# Patient Record
Sex: Male | Born: 1965 | Race: White | Hispanic: No | Marital: Married | Smoking: Never smoker
Health system: Southern US, Community
[De-identification: ages and names within clinical notes are randomized; demographics above are authoritative.]

## PROBLEM LIST (undated history)

## (undated) DIAGNOSIS — I10 Essential (primary) hypertension: Secondary | ICD-10-CM

## (undated) HISTORY — DX: Essential (primary) hypertension: I10

## (undated) HISTORY — PX: TONSILLECTOMY: SUR1361

## (undated) HISTORY — PX: OTHER SURGICAL HISTORY: SHX169

---

## 2007-09-15 ENCOUNTER — Emergency Department (HOSPITAL_COMMUNITY): Admission: EM | Admit: 2007-09-15 | Discharge: 2007-09-15 | Payer: Self-pay | Admitting: Emergency Medicine

## 2009-04-23 IMAGING — CR DG CHEST 2V
2 series · 2 of 2 positions shown · non-contrast
Comparison: No prior studies are available for comparison.

CLINICAL DATA: Chest pain. Shortness of breath. 
 CHEST - 2 VIEW:

[view not recorded (1 of 2)]
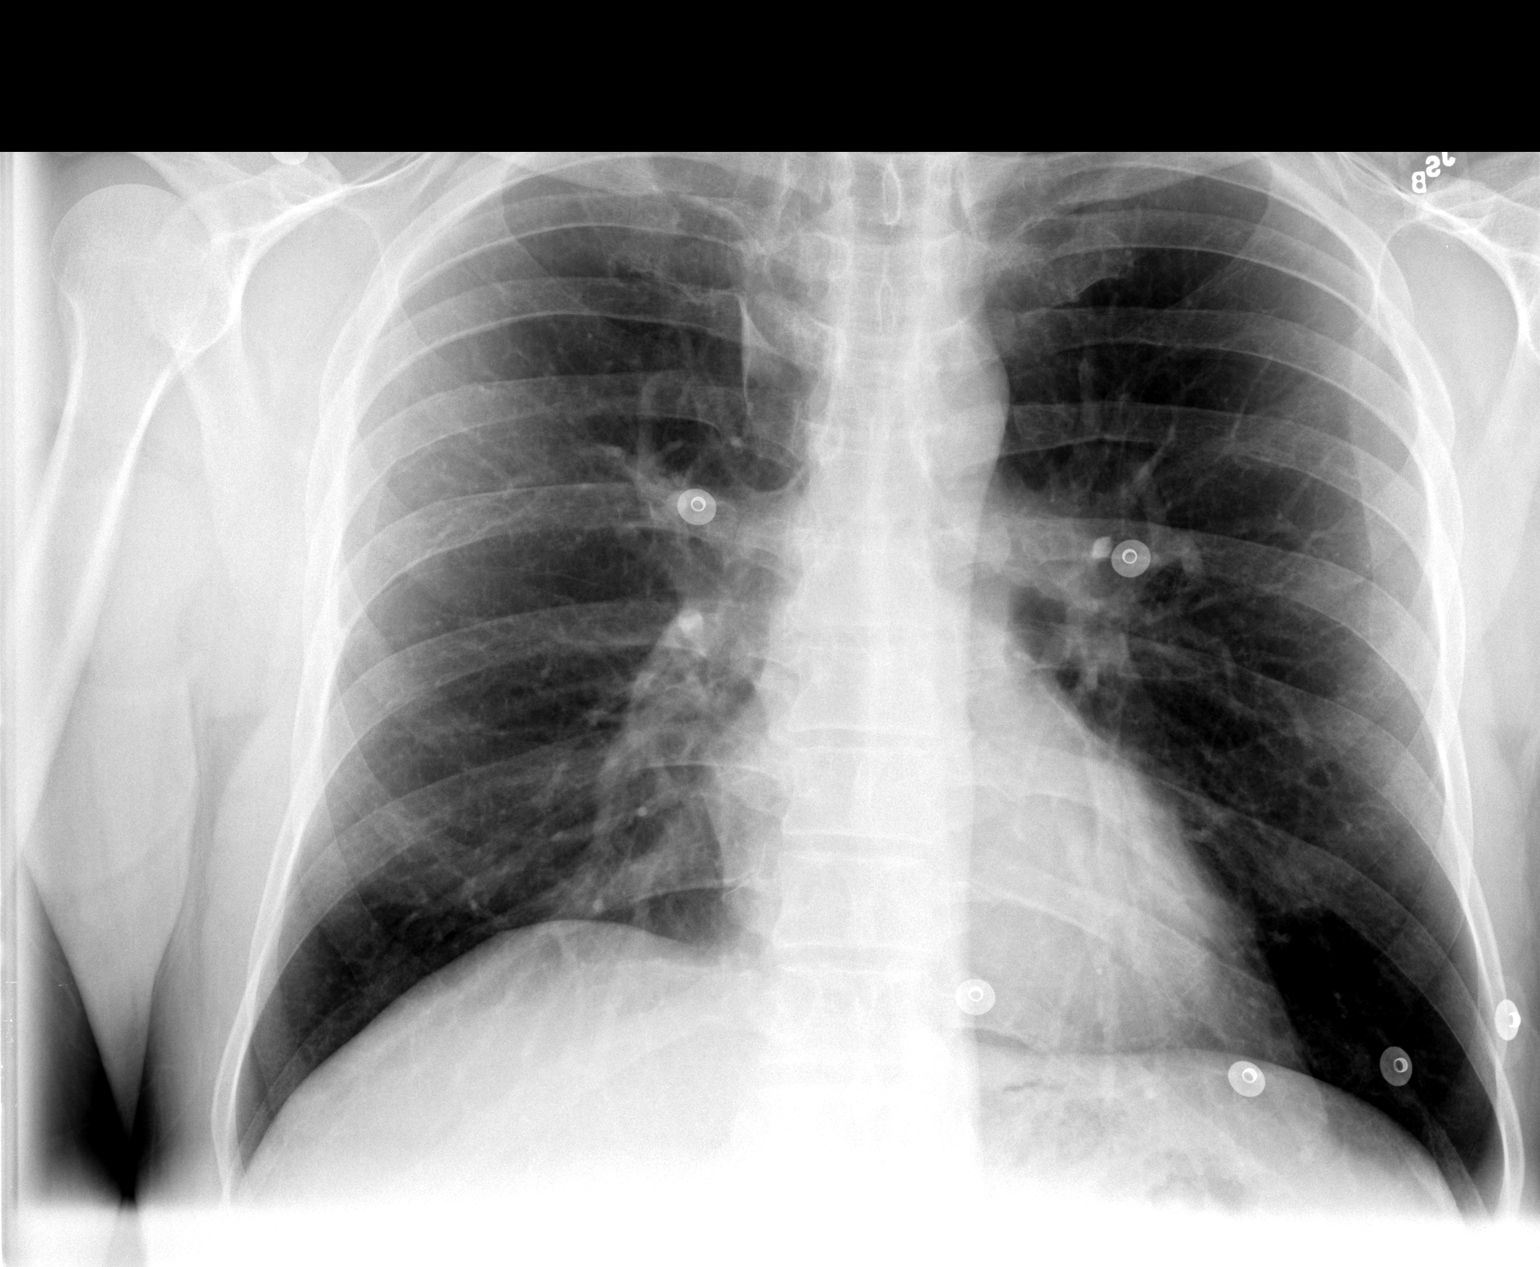

[view not recorded (2 of 2)]
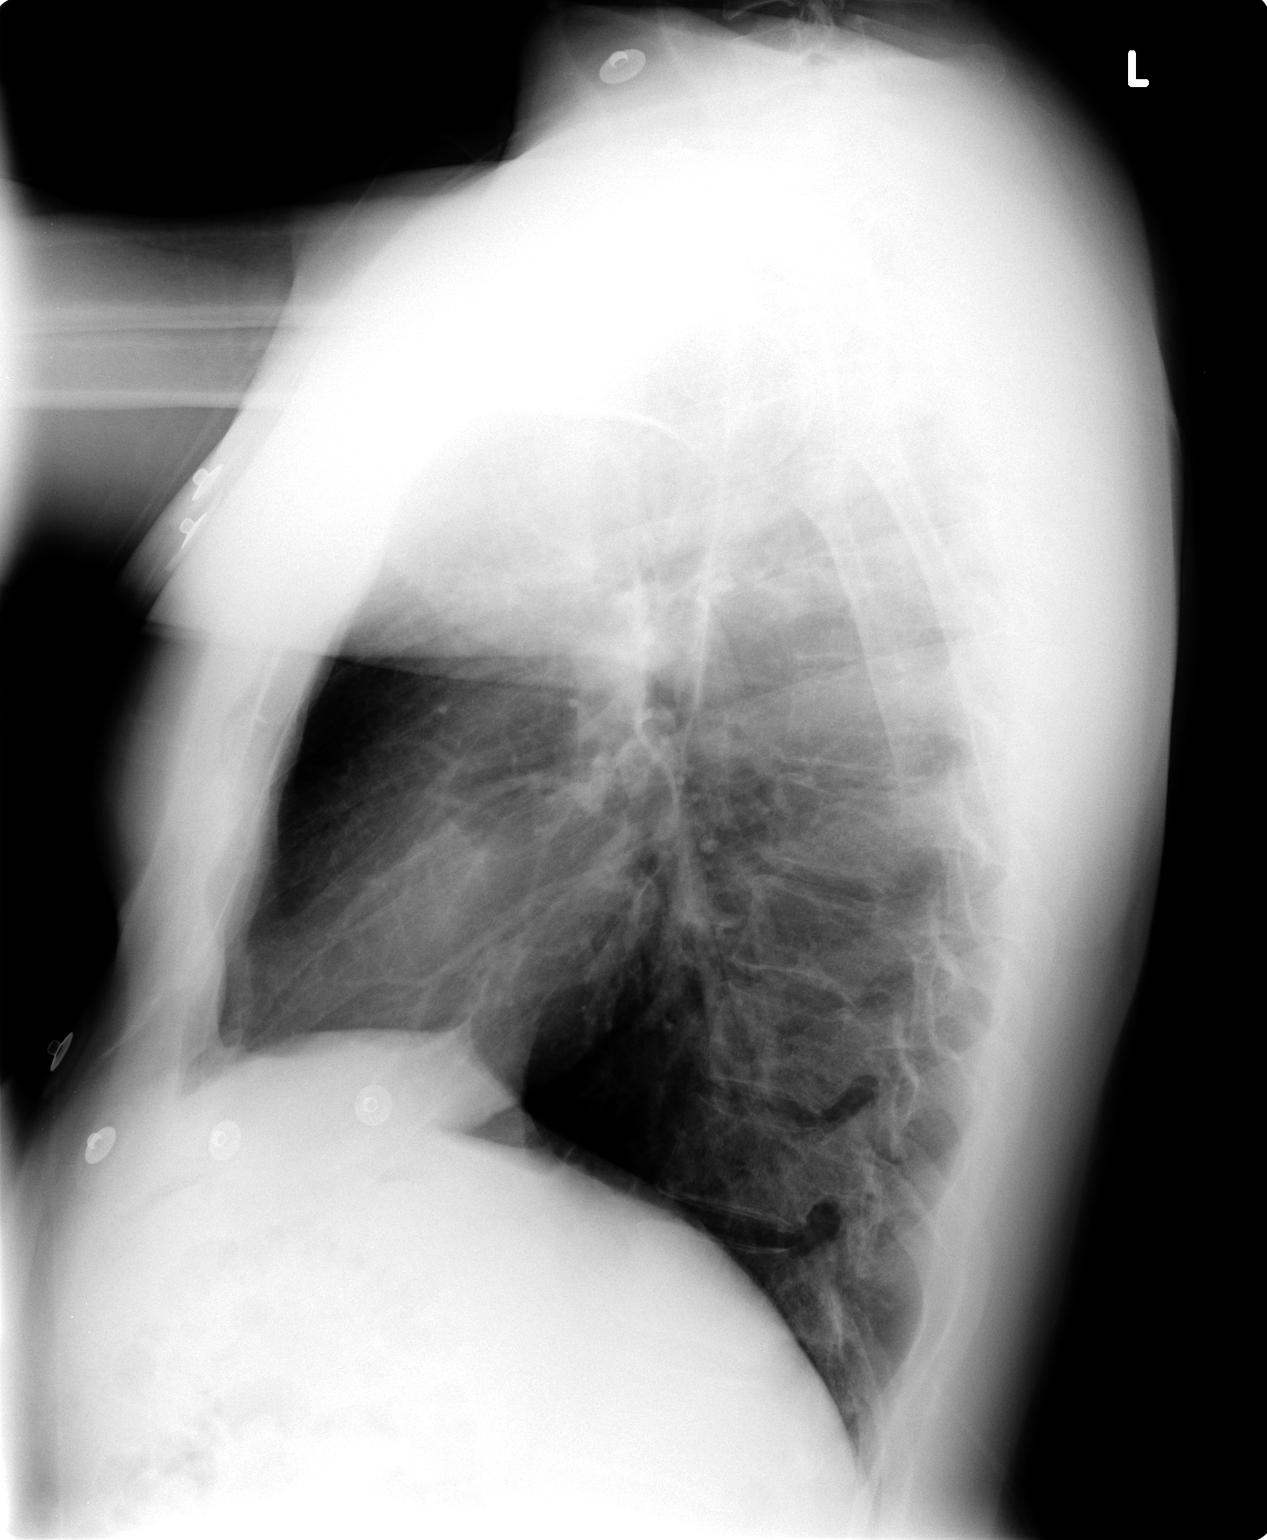

[2 of 2 positions shown; findings below may reference images not displayed]

FINDINGS: The heart and mediastinal contours are within normal limits. Suggestion of an azygos lobe, a normal variant. There is no pneumothorax, effusion, focal air space opacity, or edema.  No acute bony abnormality is identified.
IMPRESSION: No evidence of acute cardiopulmonary disease.

## 2010-06-14 ENCOUNTER — Ambulatory Visit: Payer: Self-pay | Admitting: Family Medicine

## 2010-06-14 ENCOUNTER — Encounter (INDEPENDENT_AMBULATORY_CARE_PROVIDER_SITE_OTHER): Payer: Self-pay | Admitting: *Deleted

## 2010-06-14 ENCOUNTER — Encounter: Payer: Self-pay | Admitting: Gastroenterology

## 2010-06-14 DIAGNOSIS — I10 Essential (primary) hypertension: Secondary | ICD-10-CM | POA: Insufficient documentation

## 2010-06-14 LAB — CONVERTED CEMR LAB
BUN: 15 mg/dL (ref 6–23)
Basophils Absolute: 0 10*3/uL (ref 0.0–0.1)
Basophils Relative: 0.3 % (ref 0.0–3.0)
Bilirubin, Direct: 0.1 mg/dL (ref 0.0–0.3)
CO2: 28 meq/L (ref 19–32)
Calcium: 9.5 mg/dL (ref 8.4–10.5)
Chloride: 104 meq/L (ref 96–112)
Cholesterol: 177 mg/dL (ref 0–200)
Creatinine, Ser: 0.9 mg/dL (ref 0.4–1.5)
Eosinophils Absolute: 0.1 10*3/uL (ref 0.0–0.7)
Glucose, Bld: 79 mg/dL (ref 70–99)
HDL: 37.6 mg/dL — ABNORMAL LOW (ref >39.00)
Lymphocytes Relative: 28.2 % (ref 12.0–46.0)
MCHC: 34.8 g/dL (ref 30.0–36.0)
MCV: 91.9 fL (ref 78.0–100.0)
Monocytes Absolute: 0.3 10*3/uL (ref 0.1–1.0)
Neutrophils Relative %: 62.4 % (ref 43.0–77.0)
PSA: 0.44 ng/mL (ref 0.10–4.00)
Platelets: 159 10*3/uL (ref 150.0–400.0)
RDW: 12.9 % (ref 11.5–14.6)
Total Bilirubin: 0.8 mg/dL (ref 0.3–1.2)
Total Protein: 7 g/dL (ref 6.0–8.3)
Triglycerides: 80 mg/dL (ref 0.0–149.0)

## 2010-07-14 ENCOUNTER — Ambulatory Visit: Payer: Self-pay | Admitting: Gastroenterology

## 2010-07-14 ENCOUNTER — Encounter (INDEPENDENT_AMBULATORY_CARE_PROVIDER_SITE_OTHER): Payer: Self-pay | Admitting: *Deleted

## 2010-08-16 ENCOUNTER — Ambulatory Visit
Admission: RE | Admit: 2010-08-16 | Discharge: 2010-08-16 | Payer: Self-pay | Source: Home / Self Care | Attending: Gastroenterology | Admitting: Gastroenterology

## 2010-08-16 ENCOUNTER — Encounter: Payer: Self-pay | Admitting: Gastroenterology

## 2010-08-22 ENCOUNTER — Encounter: Payer: Self-pay | Admitting: Gastroenterology

## 2010-09-05 NOTE — Letter (Signed)
Summary: [facility] Instructions  [facility]  [address] [place], [place] [address]   Phone: [phone]  Fax: [phone]       Andrew Houston    [birth year]    MRN: [id number]        Procedure Day Dorna Bloom:  [facility]  08/16/10     Arrival Time:  2:00PM     Procedure Time:  3:00PM     Location of Procedure:                    [name] (4th Floor)                      PREPARATION FOR COLONOSCOPY WITH MOVIPREP   Starting 5 days prior to your procedure 08/11/10 do not eat nuts, seeds, popcorn, corn, beans, peas,  salads, or any raw vegetables.  Do not take any fiber supplements (e.g. Metamucil, Citrucel, and Benefiber).  THE DAY BEFORE YOUR PROCEDURE         DATE: 08/11/10  DAY: TUESDAY  1.  Drink clear liquids the entire day-NO SOLID FOOD  2.  Do not drink anything colored red or purple.  Avoid juices with pulp.  No orange juice.  3.  Drink at least 64 oz. (8 glasses) of fluid/clear liquids during the day to prevent dehydration and help the prep work efficiently.  CLEAR LIQUIDS INCLUDE: Water Jello Ice Popsicles Tea (sugar ok, no milk/cream) Powdered fruit flavored drinks Coffee (sugar ok, no milk/cream) Gatorade Juice: apple, white grape, white cranberry  Lemonade Clear bullion, consomm, broth Carbonated beverages (any kind) Strained chicken noodle soup Hard Candy                             4.  In the morning, mix first dose of MoviPrep solution:    Empty 1 Pouch A and 1 Pouch B into the disposable container    Add lukewarm drinking water to the top line of the container. Mix to dissolve    Refrigerate (mixed solution should be used within 24 hrs)  5.  Begin drinking the prep at 5:00 p.m. The MoviPrep container is divided by 4 marks.   Every 15 minutes drink the solution down to the next mark (approximately 8 oz) until the full liter is complete.   6.  Follow completed prep with 16 oz of clear liquid of your choice (Nothing red  or purple).  Continue to drink clear liquids until bedtime.  7.  Before going to bed, mix second dose of MoviPrep solution:    Empty 1 Pouch A and 1 Pouch B into the disposable container    Add lukewarm drinking water to the top line of the container. Mix to dissolve    Refrigerate  THE DAY OF YOUR PROCEDURE      DATE: 08/16/10   DAY: WEDNESDAY  Beginning at 10:00AM (5 hours before procedure):         1. Every 15 minutes, drink the solution down to the next mark (approx 8 oz) until the full liter is complete.  2. Follow completed prep with 16 oz. of clear liquid of your choice.    3. You may drink clear liquids until 1:00PM (2 HOURS BEFORE PROCEDURE).   MEDICATION INSTRUCTIONS  Unless otherwise instructed, you should take regular prescription medications with a small sip of water   as early as possible the morning of  your procedure.        OTHER INSTRUCTIONS  You will need a responsible adult at least 46 years of age to accompany you and drive you home.   This person must remain in the waiting room during your procedure.  Wear loose fitting clothing that is easily removed.  Leave jewelry and other valuables at home.  However, you may wish to bring a book to read or  an iPod/MP3 player to listen to music as you wait for your procedure to start.  Remove all body piercing jewelry and leave at home.  Total time from sign-in until discharge is approximately 2-3 hours.  You should go home directly after your procedure and rest.  You can resume normal activities the  day after your procedure.  The day of your procedure you should not:   Drive   Make legal decisions   Operate machinery   Drink alcohol   Return to work  You will receive specific instructions about eating, activities and medications before you leave.    The above instructions have been reviewed and explained to me by   [name] RN  July 14, 2010 11:11 AM  _    I fully understand and  can verbalize these instructions _____________________________ Date _________  Appended Document: Moviprep Instructions Day before procedure is 08/15/10

## 2010-09-05 NOTE — Assessment & Plan Note (Signed)
Summary: NEW TO ESTABLISH/CONSULT//PH   Vital Signs:  Patient profile:   45 year old male Height:      73 inches Weight:      184 pounds BMI:     24.36 Pulse rate:   82 / minute BP sitting:   122 / 80  (right arm)  Vitals Entered By: Doristine Devoid CMA (June 14, 2010 10:07 AM) CC: NEW EST-consult   History of Present Illness: 45 yo man here today to establish care.  previous MD- Mazzochi.  hasn't seen a primary doctor in over 20 yrs.  no concerns about his health.  Preventive Screening-Counseling & Management  Alcohol-Tobacco     Alcohol drinks/day: <1     Smoking Status: never  Caffeine-Diet-Exercise     Does Patient Exercise: yes      Sexual History:  currently monogamous.        Drug Use:  never.    Current Medications (verified): 1)  None  Allergies (verified): No Known Drug Allergies  Past History:  Past Medical History: Hypertension (?) Herniated disc PVCs  Past Surgical History: Tonsillectomy Nose surgery  Family History: CAD-father bypass pig graft HTN-mother,father DM-no STROKE-no COLON CA-mother, dx'd mid to late 60s PROSTATE CA-father  Social History: married owner of [employer]Smoking Status:  never Does Patient Exercise:  yes Sexual History:  currently monogamous Drug Use:  never  Review of Systems  The patient denies anorexia, fever, weight loss, weight gain, vision loss, decreased hearing, hoarseness, chest pain, syncope, dyspnea on exertion, peripheral edema, prolonged cough, headaches, abdominal pain, melena, hematochezia, severe indigestion/heartburn, hematuria, suspicious skin lesions, depression, abnormal bleeding, enlarged lymph nodes, and testicular masses.    Physical Exam  General:  Well-developed,well-nourished,in no acute distress; alert,appropriate and cooperative throughout examination Head:  Normocephalic and atraumatic without obvious abnormalities. No apparent alopecia or balding. Eyes:  No corneal or  conjunctival inflammation noted. EOMI. Perrla. Funduscopic exam benign, without hemorrhages, exudates or papilledema. Vision grossly normal. Ears:  External ear exam shows no significant lesions or deformities.  Otoscopic examination reveals clear canals, tympanic membranes are intact bilaterally without bulging, retraction, inflammation or discharge. Hearing is grossly normal bilaterally. Nose:  External exam shows deformity from prior fx. no inflammation. Nasal mucosa are pink and moist without lesions or exudates. Mouth:  Oral mucosa and oropharynx without lesions or exudates.  Teeth in good repair. Neck:  No deformities, masses, or tenderness noted. Lungs:  Normal respiratory effort, chest expands symmetrically. Lungs are clear to auscultation, no crackles or wheezes. Heart:  Normal rate and regular rhythm. S1 and S2 normal without gallop, murmur, click, rub or other extra sounds. Abdomen:  Bowel sounds positive,abdomen soft and non-tender without masses, organomegaly or hernias noted. Rectal:  No external abnormalities noted. Normal sphincter tone. No rectal masses or tenderness. Genitalia:  Testes bilaterally descended without nodularity, tenderness or masses. No scrotal masses or lesions. No penis lesions or urethral discharge. Prostate:  Prostate gland firm and smooth, no enlargement, nodularity, tenderness, mass, asymmetry or induration. Pulses:  +2 carotid, radial, DP Extremities:  No clubbing, cyanosis, edema, or deformity noted with normal full range of motion of all joints.   Neurologic:  No cranial nerve deficits noted. Station and gait are normal. Plantar reflexes are down-going bilaterally. DTRs are symmetrical throughout. Sensory, motor and coordinative functions appear intact. Skin:  Intact without suspicious lesions or rashes Cervical Nodes:  No lymphadenopathy noted Inguinal Nodes:  No significant adenopathy Psych:  Cognition and judgment appear intact. Alert and cooperative  with  normal attention span and concentration. No apparent delusions, illusions, hallucinations   Impression & Recommendations:  Problem # 1:  PHYSICAL EXAMINATION (ICD-V70.0) Assessment New pt's PE WNL.  check labs.  anticipatory guidance provided. Orders: Venipuncture (52841) TLB-Lipid Panel (80061-LIPID) TLB-BMP (Basic Metabolic Panel-BMET) (80048-METABOL) TLB-CBC Platelet - w/Differential (85025-CBCD) TLB-Hepatic/Liver Function Pnl (80076-HEPATIC) TLB-TSH (Thyroid Stimulating Hormone) (84443-TSH) TLB-PSA (Prostate Specific Antigen) (84153-PSA) Specimen Handling (32440)  Problem # 2:  NEOPLASM, MALIGNANT, COLON, FAMILY HX (ICD-V16.0) Assessment: New  refer to GI  Orders: Gastroenterology Referral (GI)  Patient Instructions: 1)  Follow up in 1 year or as you need me 2)  Your exam looks great!  Keep up the good work! 3)  Call with any questions or concerns 4)  We'll notify you of your lab results 5)  Someone will call you with your GI appt to discuss colonoscopy given your family history of colon cancer 6)  Have a great holiday season! 7)  Welcome!  We're glad to have you!   Orders Added: 1)  Venipuncture [36415] 2)  TLB-Lipid Panel [80061-LIPID] 3)  TLB-BMP (Basic Metabolic Panel-BMET) [80048-METABOL] 4)  TLB-CBC Platelet - w/Differential [85025-CBCD] 5)  TLB-Hepatic/Liver Function Pnl [80076-HEPATIC] 6)  TLB-TSH (Thyroid Stimulating Hormone) [84443-TSH] 7)  TLB-PSA (Prostate Specific Antigen) [10272-ZDG] 8)  Specimen Handling [99000] 9)  Gastroenterology Referral [GI] 10)  New Patient 40-64 years [phone]

## 2010-09-05 NOTE — Miscellaneous (Signed)
Summary: [facility] Previsit/prep  Clinical Lists Changes  Medications: Added new medication of MOVIPREP 100 GM  SOLR (PEG-KCL-NACL-NASULF-NA ASC-C) As per prep instructions. - Signed Rx of MOVIPREP 100 GM  SOLR (PEG-KCL-NACL-NASULF-NA ASC-C) As per prep instructions.;  #1 x 0;  Signed;  Entered by: [name] RN;  Authorized by: Meryl Dare MD [facility];  Method used: Electronically to [facility] #[id number]*, [address], [place], [place], [place]  [address], Ph: [phone], Fax: [phone] Observations: Added new observation of NKA: T (07/14/2010 10:17)    Prescriptions: MOVIPREP 100 GM  SOLR (PEG-KCL-NACL-NASULF-NA ASC-C) As per prep instructions.  #1 x 0   Entered by:   [name] RN   Authorized by:   Meryl Dare MD [facility]   Signed by:   [name] RN on 07/14/2010   Method used:   Electronically to        [id number] (retail)       [address]       [place], [place]  [address]       Ph: [phone]       Fax: [phone]   RxID:   [id number]

## 2010-09-05 NOTE — Letter (Signed)
Summary: Pre Visit Letter Revised  [facility]  [address] [place], [place] [address]   Phone: [phone]  Fax: [phone]        06/14/2010 MRN: [id number]  Andrew Houston [address] [place], [place]  [address]             Procedure Date:  12-23 at 11:30am           Dr Russella Dar   Welcome to the Gastroenterology Division at [facility].    You are scheduled to see a nurse for your pre-procedure visit on 07-14-10 at 10:30am on the 3rd floor at [facility], [address]  We ask that you try to arrive at our office 15 minutes prior to your appointment time to allow for check-in.  Please take a minute to review the attached form.  If you answer "Yes" to one or more of the questions on the first page, we ask that you call the person listed at your earliest opportunity.  If you answer "No" to all of the questions, please complete the rest of the form and bring it to your appointment.    Your nurse visit will consist of discussing your medical and surgical history, your immediate family medical history, and your medications.   If you are unable to list all of your medications on the form, please bring the medication bottles to your appointment and we will list them.  We will need to be aware of both prescribed and over the counter drugs.  We will need to know exact dosage information as well.    Please be prepared to read and sign documents such as consent forms, a financial agreement, and acknowledgement forms.  If necessary, and with your consent, a friend or relative is welcome to sit-in on the nurse visit with you.  Please bring your insurance card so that we may make a copy of it.  If your insurance requires a referral to see a specialist, please bring your referral form from your primary care physician.  No co-pay is required for this nurse visit.     If you cannot keep your appointment, please call [phone] to cancel or reschedule prior to your  appointment date.  This allows [place] the opportunity to schedule an appointment for another patient in need of care.    Thank you for choosing [facility] for your medical needs.  We appreciate the opportunity to care for you.  Please visit [place] at our website  to learn more about our practice.  Sincerely, The Gastroenterology Division

## 2010-09-05 NOTE — Letter (Signed)
Summary: Primary Care Consult Scheduled Letter  [facility]  [address] [place], [place] [address]   Phone: [phone]  Fax: [phone]      06/14/2010 MRN: [id number]  Andrew Houston [address] [place], [place]  [address]    Dear Mr. LAMPERT,    We have scheduled an appointment for you.  At the recommendation of Dr. Neena Rhymes, we have scheduled you for a Screening Colonoscopy with Dr. Claudette Head of [facility].  Your initial Pre-visit with a nurse is on 07-14-2010 at 10:30am.  Your Colonoscopy is on 07-28-2010 arrive by 10:30am.  Their address is [address], [place] [address]. The office phone number is [phone].  If this appointment day and time is not convenient for you, please feel free to call the office of the doctor you are being referred to at the number listed above and reschedule the appointment.    It is important for you to keep your scheduled appointments. We are here to make sure you are given good patient care.  Thank you,    Renee, Patient Care Coordinator [place] [facility]

## 2010-09-07 NOTE — Procedures (Addendum)
Summary: Colonoscopy  Patient: Andrew Houston Note: All result statuses are Final unless otherwise noted.  Tests: (1) Colonoscopy (COL)   COL Colonoscopy           DONE     [facility]     [address]     [place], [place]  [address]           COLONOSCOPY PROCEDURE REPORT           PATIENT:  Andrew, Houston  MR#:  [id number]     BIRTHDATE:  [birth year], 44 yrs. old  GENDER:  male     ENDOSCOPIST:  Judie Petit T. Russella Dar, MD, [facility]     Referred by:  Helane Rima. Beverely Low, M.D.     PROCEDURE DATE:  08/16/2010     PROCEDURE:  Colonoscopy with biopsy and snare polypectomy     ASA CLASS:  Class I     INDICATIONS:  1) elevated risk screening  2) family history of     colon cancer: mother at 75.     MEDICATIONS:   Fentanyl 75 mcg IV, Versed 12 mg IV     DESCRIPTION OF PROCEDURE:   After the risks benefits and     alternatives of the procedure were thoroughly explained, informed     consent was obtained.  Digital rectal exam was performed and     revealed no abnormalities.   The LB [id number]-[id number] endoscope     was introduced through the anus and advanced to the cecum, which     was identified by both the appendix and ileocecal valve, without     limitations.  The quality of the prep was excellent, using     MoviPrep.  The instrument was then slowly withdrawn as the colon     was fully examined.     <<PROCEDUREIMAGES>>     FINDINGS:  A sessile polyp was found in the ascending colon. It     was 3 mm in size. The polyp was removed using cold biopsy forceps.     A sessile polyp was found at the hepatic flexure. It was 6 mm in     size. Polyp was snared without cautery. Retrieval was successful.     Mild diverticulosis was found in the sigmoid colon. A normal     appearing cecum, ileocecal valve, and appendiceal orifice were     identified. The transverse, splenic flexure, descendin colon, and     rectum appeared unremarkable. Retroflexed views in the rectum     revealed no abnormalities.   The time to cecum =  2  minutes. The     scope was then withdrawn (time =  10.67  min) from the patient and     the procedure completed.           COMPLICATIONS:  None           ENDOSCOPIC IMPRESSION:     1) 3 mm sessile polyp in the ascending colon     2) 6 mm sessile polyp at the hepatic flexure     3) Mild diverticulosis in the sigmoid colon           RECOMMENDATIONS:     1) Await pathology results     2) High fiber diet with liberal fluid intake.     3) Repeat Colonoscopy in 5 years.           Venita Lick. Russella Dar, MD, [facility]  n.     eSIGNED:   Malcolm T. Stark at 08/16/2010 02:34 PM           Junious Silk, [id number]  Note: An exclamation mark (!) indicates a result that was not dispersed into the flowsheet. Document Creation Date: 08/16/2010 2:34 PM _______________________________________________________________________  (1) Order result status: Final Collection or observation date-time: 08/16/2010 14:25 Requested date-time:  Receipt date-time:  Reported date-time:  Referring Physician:   Ordering Physician: Claudette Head [id number]) Specimen Source:  Source: Launa Grill Order Number: [id number] Lab site:   Appended Document: Colonoscopy recall     Procedures Next Due Date:    Colonoscopy: 08/2015

## 2010-09-07 NOTE — Letter (Addendum)
Summary: Patient Notice- Polyp Results  [facility]  [address] [place], [place] [address]   Phone: [phone]  Fax: [phone]        August 22, 2010 MRN: [id number]    Andrew Houston [address] [place], [place]  [address]    Dear [name],  I am pleased to inform you that the colon polyps removed during your recent colonoscopy were found to be benign (no cancer detected) upon pathologic examination.  I recommend you have a repeat colonoscopy examination in 5 years to look for recurrent polyps, as having colon polyps increases your risk for having recurrent polyps or even colon cancer in the future.  Should you develop new or worsening symptoms of abdominal pain, bowel habit changes or bleeding from the rectum or bowels, please schedule an evaluation with either your primary care physician or with me.  Continue treatment plan as outlined the day of your exam.  Please call us if you are having persistent problems or have questions about your condition that have not been fully answered at this time.  Sincerely,  Meryl Dare MD [facility]  This letter has been electronically signed by your physician.  Appended Document: Patient Notice- Polyp Results Letter mailed

## 2011-04-27 LAB — BASIC METABOLIC PANEL
Chloride: 107
Creatinine, Ser: 1.1
GFR calc Af Amer: >60
Sodium: 140

## 2011-04-27 LAB — CBC
Hemoglobin: 14.5
MCV: 89.9
RBC: 4.68
WBC: 8.7

## 2011-04-27 LAB — DIFFERENTIAL
Eosinophils Absolute: 0.1
Lymphs Abs: 1.1
Monocytes Relative: 6
Neutro Abs: 7
Neutrophils Relative %: 80 — ABNORMAL HIGH

## 2011-04-27 LAB — POCT CARDIAC MARKERS
CKMB, poc: <1 — ABNORMAL LOW
Myoglobin, poc: 56.5
Myoglobin, poc: 58.6
Operator id: 151321

## 2015-08-29 ENCOUNTER — Encounter: Payer: Self-pay | Admitting: Gastroenterology

## 2018-05-02 ENCOUNTER — Encounter: Payer: Self-pay | Admitting: Gastroenterology

## 2018-06-02 ENCOUNTER — Ambulatory Visit (AMBULATORY_SURGERY_CENTER): Payer: Self-pay

## 2018-06-02 ENCOUNTER — Encounter: Payer: Self-pay | Admitting: Gastroenterology

## 2018-06-02 VITALS — Ht 73.0 in | Wt 201.2 lb

## 2018-06-02 DIAGNOSIS — Z86010 Personal history of colonic polyps: Secondary | ICD-10-CM

## 2018-06-02 MED ORDER — NA SULFATE-K SULFATE-MG SULF 17.5-3.13-1.6 GM/177ML PO SOLN: 1.0000 | Freq: Once | ORAL | 0 refills | Status: AC

## 2018-06-02 NOTE — Progress Notes (Signed)
Denies allergies to eggs or soy products. Denies complication of anesthesia or sedation. Denies use of weight loss medication. Denies use of O2.   Emmi instructions declined.    Patient states that he chew's tobacco. Patient was informed not to chew tobacco while he is prepping for his colonoscopy. Patient was told that chewing tobacco is considered food. Patient verbalizes understanding and states that he will not use it while prepping.

## 2018-06-06 HISTORY — PX: COLONOSCOPY: SHX174

## 2018-06-16 ENCOUNTER — Ambulatory Visit (AMBULATORY_SURGERY_CENTER): Payer: BLUE CROSS/BLUE SHIELD | Admitting: Gastroenterology

## 2018-06-16 ENCOUNTER — Other Ambulatory Visit: Payer: Self-pay

## 2018-06-16 ENCOUNTER — Encounter: Payer: Self-pay | Admitting: Gastroenterology

## 2018-06-16 VITALS — BP 123/83 | HR 73 | Temp 97.3°F | Resp 26 | Ht 73.0 in | Wt 201.0 lb

## 2018-06-16 DIAGNOSIS — D123 Benign neoplasm of transverse colon: Secondary | ICD-10-CM

## 2018-06-16 DIAGNOSIS — Z860101 Personal history of adenomatous and serrated colon polyps: Secondary | ICD-10-CM

## 2018-06-16 DIAGNOSIS — Z86010 Personal history of colonic polyps: Secondary | ICD-10-CM

## 2018-06-16 MED ORDER — SODIUM CHLORIDE 0.9 % IV SOLN: 500.0000 mL | Freq: Once | INTRAVENOUS | Status: DC

## 2018-06-16 NOTE — Progress Notes (Signed)
Pt's states no medical or surgical changes since previsit or office visit. 

## 2018-06-16 NOTE — Progress Notes (Signed)
Report given to PACU, vss 

## 2018-06-16 NOTE — Progress Notes (Signed)
Called to room to assist during endoscopic procedure.  Patient ID and intended procedure confirmed with present staff. Received instructions for my participation in the procedure from the performing physician.  

## 2018-06-16 NOTE — Patient Instructions (Signed)
   INFORMATION ON POLYPS,DIVERTICULOSIS,HEMORRHOIDS AND HIGH FIBER DIET GIVEN TO YOU TODAY  AWAIT PATHOLOGY RESULTS IN A LETTER FROM DR Fuller Plan    YOU HAD AN ENDOSCOPIC PROCEDURE TODAY AT [facility]:   Refer to the procedure report that was given to you for any specific questions about what was found during the examination.  If the procedure report does not answer your questions, please call your gastroenterologist to clarify.  If you requested that your care partner not be given the details of your procedure findings, then the procedure report has been included in a sealed envelope for you to review at your convenience later.  YOU SHOULD EXPECT: Some feelings of bloating in the abdomen. Passage of more gas than usual.  Walking can help get rid of the air that was put into your GI tract during the procedure and reduce the bloating. If you had a lower endoscopy (such as a colonoscopy or flexible sigmoidoscopy) you may notice spotting of blood in your stool or on the toilet paper. If you underwent a bowel prep for your procedure, you may not have a normal bowel movement for a few days.  Please Note:  You might notice some irritation and congestion in your nose or some drainage.  This is from the oxygen used during your procedure.  There is no need for concern and it should clear up in a day or so.  SYMPTOMS TO REPORT IMMEDIATELY:   Following lower endoscopy (colonoscopy or flexible sigmoidoscopy):  Excessive amounts of blood in the stool  Significant tenderness or worsening of abdominal pains  Swelling of the abdomen that is new, acute  Fever of 100F or higher    For urgent or emergent issues, a gastroenterologist can be reached at any hour by calling [phone].   DIET:  We do recommend a small meal at first, but then you may proceed to your regular diet.  Drink plenty of fluids but you should avoid alcoholic beverages for 24 hours.  ACTIVITY:  You should plan to take  it easy for the rest of today and you should NOT DRIVE or use heavy machinery until tomorrow (because of the sedation medicines used during the test).    FOLLOW UP: Our staff will call the number listed on your records the next business day following your procedure to check on you and address any questions or concerns that you may have regarding the information given to you following your procedure. If we do not reach you, we will leave a message.  However, if you are feeling well and you are not experiencing any problems, there is no need to return our call.  We will assume that you have returned to your regular daily activities without incident.  If any biopsies were taken you will be contacted by phone or by letter within the next 1-3 weeks.  Please call us at [phone] if you have not heard about the biopsies in 3 weeks.    SIGNATURES/CONFIDENTIALITY: You and/or your care partner have signed paperwork which will be entered into your electronic medical record.  These signatures attest to the fact that that the information above on your After Visit Summary has been reviewed and is understood.  Full responsibility of the confidentiality of this discharge information lies with you and/or your care-partner.

## 2018-06-16 NOTE — Op Note (Signed)
[facility] Patient Name: Andrew Houston Procedure Date: 06/16/2018 8:39 AM MRN: [id number] Endoscopist: Ladene Artist , MD Age: 52 Referring MD:  Date of Birth: Apr 15, 1966 Gender: Male Account #: [id number] Procedure:                Colonoscopy Indications:              Surveillance: Personal history of adenomatous                            polyps on last colonoscopy 5 years ago Medicines:                Monitored Anesthesia Care Procedure:                Pre-Anesthesia Assessment:                           - Prior to the procedure, a History and Physical                            was performed, and patient medications and                            allergies were reviewed. The patient's tolerance of                            previous anesthesia was also reviewed. The risks                            and benefits of the procedure and the sedation                            options and risks were discussed with the patient.                            All questions were answered, and informed consent                            was obtained. Prior Anticoagulants: The patient has                            taken no previous anticoagulant or antiplatelet                            agents. ASA Grade Assessment: II - A patient with                            mild systemic disease. After reviewing the risks                            and benefits, the patient was deemed in                            satisfactory condition to undergo the procedure.  After obtaining informed consent, the colonoscope                            was passed under direct vision. Throughout the                            procedure, the patient's blood pressure, pulse, and                            oxygen saturations were monitored continuously. The                            Model CF-HQ190L [id number]) scope was introduced                            through the anus and  advanced to the the cecum,                            identified by appendiceal orifice and ileocecal                            valve. The ileocecal valve, appendiceal orifice,                            and rectum were photographed. The quality of the                            bowel preparation was excellent. The colonoscopy                            was performed without difficulty. The patient                            tolerated the procedure well. Scope In: 8:45:04 AM Scope Out: 8:58:17 AM Scope Withdrawal Time: 0 hours 11 minutes 19 seconds  Total Procedure Duration: 0 hours 13 minutes 13 seconds  Findings:                 The perianal and digital rectal examinations were                            normal.                           Two sessile polyps were found in the transverse                            colon. The polyps were 5 to 8 mm in size. These                            polyps were removed with a cold snare. Resection                            and retrieval were complete.  Multiple medium-mouthed diverticula were found in                            the sigmoid colon, descending colon and transverse                            colon.                           Anal papilla(e) were hypertrophied.                           Internal hemorrhoids were found during                            retroflexion. The hemorrhoids were small and Grade                            I (internal hemorrhoids that do not prolapse).                           The exam was otherwise without abnormality on                            direct and retroflexion views. Complications:            No immediate complications. Estimated blood loss:                            None. Estimated Blood Loss:     Estimated blood loss: none. Impression:               - Two 5 to 8 mm polyps in the transverse colon,                            removed with a cold snare. Resected and retrieved.                            - Diverticulosis in the sigmoid colon, in the                            descending colon and in the transverse colon.                           - Anal papilla(e) were hypertrophied.                           - Internal hemorrhoids.                           - The examination was otherwise normal on direct                            and retroflexion views. Recommendation:           - Repeat colonoscopy in 5 years for surveillance.                           -  Patient has a contact number available for                            emergencies. The signs and symptoms of potential                            delayed complications were discussed with the                            patient. Return to normal activities tomorrow.                            Written discharge instructions were provided to the                            patient.                           - High fiber diet.                           - Continue present medications.                           - Await pathology results. Ladene Artist, MD 06/16/2018 9:01:19 AM This report has been signed electronically.

## 2018-06-17 ENCOUNTER — Telehealth: Payer: Self-pay | Admitting: *Deleted

## 2018-06-17 NOTE — Telephone Encounter (Signed)
  Follow up Call-  Call back number 06/16/2018  Post procedure Call Back phone  # [phone]  Permission to leave phone message Yes  Some recent data might be hidden     Patient questions:  Do you have a fever, pain , or abdominal swelling? No. Pain Score  0 *  Have you tolerated food without any problems? Yes.    Have you been able to return to your normal activities? Yes.    Do you have any questions about your discharge instructions: Diet   No. Medications  No. Follow up visit  No.  Do you have questions or concerns about your Care? No.  Actions: * If pain score is 4 or above: No action needed, pain <4.

## 2018-07-14 ENCOUNTER — Encounter: Payer: Self-pay | Admitting: Gastroenterology

## 2018-10-13 ENCOUNTER — Encounter: Payer: Self-pay | Admitting: Family Medicine

## 2018-10-13 ENCOUNTER — Ambulatory Visit: Payer: BLUE CROSS/BLUE SHIELD | Admitting: Family Medicine

## 2018-10-13 ENCOUNTER — Other Ambulatory Visit: Payer: Self-pay

## 2018-10-13 DIAGNOSIS — I10 Essential (primary) hypertension: Secondary | ICD-10-CM | POA: Diagnosis not present

## 2018-10-13 NOTE — Patient Instructions (Signed)
Great to meet you! Follow up with me for a Annual physical/check up at a time that is convenient for you.  I generally recommend doing this around your birthday.

## 2018-10-13 NOTE — Progress Notes (Signed)
Andrew Houston - 53 y.o. male MRN [id number]  Date of birth: 1965/12/22  Subjective Chief Complaint  Patient presents with  . Establish Care    HPI Andrew Houston is a 53 y.o. male  here today to establish care with new PCP.  He has listed history of HTN however has never been on medication for management of this.  He does monitor BP at home with readings of 120-130/75-85.  He denies any symptoms related to HTN including chest pain, shortness of breath, palpitations, headache or vision changes.  He is up to date on colon cancer screening as he has a family history of colon cancer.  He is due for updated labs and will schedule a CPE at a later date.   ROS:  A comprehensive ROS was completed and negative except as noted per HPI  No Known Allergies  Past Medical History:  Diagnosis Date  . Hypertension     Past Surgical History:  Procedure Laterality Date  . TONSILLECTOMY    . Tubes in ears      Social History   Socioeconomic History  . Marital status: Married    Spouse name: Not on file  . Number of children: Not on file  . Years of education: Not on file  . Highest education level: Not on file  Occupational History  . Not on file  Social Needs  . Financial resource strain: Not on file  . Food insecurity:    Worry: Not on file    Inability: Not on file  . Transportation needs:    Medical: Not on file    Non-medical: Not on file  Tobacco Use  . Smoking status: Never Smoker  . Smokeless tobacco: Current User    Types: Chew  Substance and Sexual Activity  . Alcohol use: Yes    Comment: weekly beer  . Drug use: Never  . Sexual activity: Not on file  Lifestyle  . Physical activity:    Days per week: Not on file    Minutes per session: Not on file  . Stress: Not on file  Relationships  . Social connections:    Talks on phone: Not on file    Gets together: Not on file    Attends religious service: Not on file    Active member of club or organization: Not on file   Attends meetings of clubs or organizations: Not on file    Relationship status: Not on file  Other Topics Concern  . Not on file  Social History Narrative  . Not on file    Family History  Problem Relation Age of Onset  . Colon cancer Mother   . Colon cancer Maternal Uncle   . Esophageal cancer Neg Hx   . Rectal cancer Neg Hx   . Stomach cancer Neg Hx     Health Maintenance  Topic Date Due  . HIV Screening  12/31/1979  . TETANUS/TDAP  12/31/1983  . INFLUENZA VACCINE  03/06/2018  . COLONOSCOPY  06/17/2023    ----------------------------------------------------------------------------------------------------------------------------------------------------------------------------------------------------------------- Physical Exam BP 138/85 (BP Location: Left Arm, Patient Position: Sitting, Cuff Size: Normal)   Pulse 100   Temp 97.6 F (36.4 C) (Oral)   Ht 6\' 1"  (1.854 m)   Wt 203 lb 6.4 oz (92.3 kg)   SpO2 96%   BMI 26.84 kg/m   Physical Exam Constitutional:      Appearance: Normal appearance.  HENT:     Head: Normocephalic and atraumatic.     Right Ear:  Tympanic membrane normal.     Left Ear: Tympanic membrane normal.     Mouth/Throat:     Mouth: Mucous membranes are moist.  Eyes:     General: No scleral icterus. Neck:     Musculoskeletal: Neck supple.  Cardiovascular:     Rate and Rhythm: Normal rate and regular rhythm.  Pulmonary:     Effort: Pulmonary effort is normal.     Breath sounds: Normal breath sounds.  Skin:    General: Skin is warm and dry.     Findings: No rash.  Neurological:     General: No focal deficit present.     Mental Status: He is alert.  Psychiatric:        Mood and Affect: Mood normal.        Behavior: Behavior normal.      ------------------------------------------------------------------------------------------------------------------------------------------------------------------------------------------------------------------- Assessment and Plan  Essential hypertension -BP elevated initially however better controlled on recheck.  HE will continue to monitor at home -Recommend low sodium diet with regular exercise No follow-ups on file.

## 2018-10-13 NOTE — Assessment & Plan Note (Signed)
-  BP elevated initially however better controlled on recheck.  HE will continue to monitor at home -Recommend low sodium diet with regular exercise No follow-ups on file.

## 2018-12-22 ENCOUNTER — Encounter: Payer: BLUE CROSS/BLUE SHIELD | Admitting: Family Medicine

## 2019-02-13 ENCOUNTER — Telehealth: Payer: Self-pay

## 2019-02-13 NOTE — Telephone Encounter (Signed)
Questions for Screening COVID-19  Symptom onset: none , Pt stated no COVID + contacts, Pt repeated #[id number] back after given   Travel or Contacts: none  During this illness, did/does the patient experience any of the following symptoms? Fever >100.52F []   Yes [x]   No []   Unknown Subjective fever (felt feverish) []   Yes [x]   No []   Unknown Chills []   Yes [x]   No []   Unknown Muscle aches (myalgia) []   Yes [x]   No []   Unknown Runny nose (rhinorrhea) []   Yes [x]   No []   Unknown Sore throat []   Yes [x]   No []   Unknown Cough (new onset or worsening of chronic cough) []   Yes [x]   No []   Unknown Shortness of breath (dyspnea) []   Yes [x]   No []   Unknown Nausea or vomiting []   Yes [x]   No []   Unknown Headache []   Yes [x]   No []   Unknown Abdominal pain  []   Yes [x]   No []   Unknown Diarrhea (?3 loose/looser than normal stools/24hr period) []   Yes [x]   No []   Unknown Other, specify:  Patient risk factors: Smoker? [x]   Current []   Former []   Never If male, currently pregnant? []   Yes [x]   No  Patient Active Problem List   Diagnosis Date Noted  . Essential hypertension 06/14/2010    Plan:  []   High risk for COVID-19 with red flags go to ED (with CP, SOB, weak/lightheaded, or fever > 101.5). Call ahead.  []   High risk for COVID-19 but stable. Inform provider and coordinate time for [facility] visit.   [x]   No red flags but URI signs or symptoms okay for [facility] visit.

## 2019-02-16 ENCOUNTER — Encounter: Payer: Self-pay | Admitting: Family Medicine

## 2019-02-16 ENCOUNTER — Ambulatory Visit (INDEPENDENT_AMBULATORY_CARE_PROVIDER_SITE_OTHER): Payer: BC Managed Care – PPO | Admitting: Family Medicine

## 2019-02-16 VITALS — BP 152/94 | HR 76 | Temp 97.8°F | Resp 18 | Ht 72.75 in | Wt 198.0 lb

## 2019-02-16 DIAGNOSIS — Z0000 Encounter for general adult medical examination without abnormal findings: Secondary | ICD-10-CM

## 2019-02-16 DIAGNOSIS — Z13220 Encounter for screening for lipoid disorders: Secondary | ICD-10-CM | POA: Diagnosis not present

## 2019-02-16 DIAGNOSIS — I10 Essential (primary) hypertension: Secondary | ICD-10-CM | POA: Diagnosis not present

## 2019-02-16 DIAGNOSIS — Z125 Encounter for screening for malignant neoplasm of prostate: Secondary | ICD-10-CM | POA: Diagnosis not present

## 2019-02-16 LAB — VITAMIN D 25 HYDROXY (VIT D DEFICIENCY, FRACTURES): VITD: 22.84 ng/mL — ABNORMAL LOW (ref 30.00–100.00)

## 2019-02-16 LAB — COMPREHENSIVE METABOLIC PANEL
ALT: 17 U/L (ref 0–53)
AST: 17 U/L (ref 0–37)
Albumin: 4.5 g/dL (ref 3.5–5.2)
Alkaline Phosphatase: 49 U/L (ref 39–117)
BUN: 17 mg/dL (ref 6–23)
CO2: 24 mEq/L (ref 19–32)
Calcium: 9.2 mg/dL (ref 8.4–10.5)
Chloride: 104 mEq/L (ref 96–112)
Creatinine, Ser: 0.94 mg/dL (ref 0.40–1.50)
GFR: 83.59 mL/min (ref >60.00)
Glucose, Bld: 97 mg/dL (ref 70–99)
Potassium: 4.1 mEq/L (ref 3.5–5.1)
Sodium: 138 mEq/L (ref 135–145)
Total Bilirubin: 0.5 mg/dL (ref 0.2–1.2)
Total Protein: 6.7 g/dL (ref 6.0–8.3)

## 2019-02-16 LAB — LIPID PANEL
Cholesterol: 178 mg/dL (ref 0–200)
HDL: 43 mg/dL (ref >39.00)
LDL Cholesterol: 108 mg/dL — ABNORMAL HIGH (ref 0–99)
NonHDL: 134.75
Total CHOL/HDL Ratio: 4
Triglycerides: 135 mg/dL (ref 0.0–149.0)
VLDL: 27 mg/dL (ref 0.0–40.0)

## 2019-02-16 LAB — CBC
HCT: 43.5 % (ref 39.0–52.0)
Hemoglobin: 14.8 g/dL (ref 13.0–17.0)
MCHC: 33.9 g/dL (ref 30.0–36.0)
MCV: 93.5 fl (ref 78.0–100.0)
Platelets: 167 10*3/uL (ref 150.0–400.0)
RBC: 4.65 Mil/uL (ref 4.22–5.81)
RDW: 12.9 % (ref 11.5–15.5)
WBC: 5.2 10*3/uL (ref 4.0–10.5)

## 2019-02-16 LAB — TSH: TSH: 1.16 u[IU]/mL (ref 0.35–4.50)

## 2019-02-16 LAB — PSA: PSA: 0.65 ng/mL (ref 0.10–4.00)

## 2019-02-16 MED ORDER — OLMESARTAN MEDOXOMIL 20 MG PO TABS: 10.0000 mg | ORAL_TABLET | Freq: Every day | ORAL | 0 refills | Status: DC

## 2019-02-16 NOTE — Assessment & Plan Note (Signed)
BP elevated initially and on recheck today.  Start benicar 10mg  daily, recommend low salt diet.  Follow up in 2 months.

## 2019-02-16 NOTE — Assessment & Plan Note (Signed)
Well adult Orders Placed This Encounter  Procedures  . Comp Met (CMET)  . CBC  . Lipid panel  . TSH  . PSA  . Vitamin D (25 hydroxy)  Screenings: lipid and PSA Immunizations:  Unsure of last tdap Anticipatory guidance/Risk factor reduction:  Recommendations per AVS

## 2019-02-16 NOTE — Progress Notes (Signed)
Andrew Houston - 53 y.o. male MRN [id number]  Date of birth: 12-Jul-1966  Subjective Chief Complaint  Patient presents with  . Annual Exam    CPE/Labs    HPI Andrew Houston is a 53 y.o. male with history of HTN here today for annual exam.  BP elevated today, admits to being more sedentary and diet not as good during COVID quarantine.  Denies other changes or concerns.  Colonoscopy up to date.  He does not smoke or vape.  Drinks EtOH on occasion.   Review of Systems  Constitutional: Negative for chills, fever, malaise/fatigue and weight loss.  HENT: Negative for congestion, ear pain and sore throat.   Eyes: Negative for blurred vision, double vision and pain.  Respiratory: Negative for cough and shortness of breath.   Cardiovascular: Negative for chest pain and palpitations.  Gastrointestinal: Negative for abdominal pain, blood in stool, constipation, heartburn and nausea.  Genitourinary: Negative for dysuria and urgency.  Musculoskeletal: Negative for joint pain and myalgias.  Neurological: Negative for dizziness and headaches.  Endo/Heme/Allergies: Does not bruise/bleed easily.  Psychiatric/Behavioral: Negative for depression. The patient is not nervous/anxious and does not have insomnia.     No Known Allergies  Past Medical History:  Diagnosis Date  . Hypertension     Past Surgical History:  Procedure Laterality Date  . TONSILLECTOMY    . Tubes in ears      Social History   Socioeconomic History  . Marital status: Married    Spouse name: Not on file  . Number of children: Not on file  . Years of education: Not on file  . Highest education level: Not on file  Occupational History  . Not on file  Social Needs  . Financial resource strain: Not very hard  . Food insecurity    Worry: Never true    Inability: Never true  . Transportation needs    Medical: No    Non-medical: No  Tobacco Use  . Smoking status: Never Smoker  . Smokeless tobacco: Current User    Types:  Chew  Substance and Sexual Activity  . Alcohol use: Yes    Comment: weekly beer  . Drug use: Never  . Sexual activity: Yes  Lifestyle  . Physical activity    Days per week: 5 days    Minutes per session: 60 min  . Stress: Only a little  Relationships  . Social connections    Talks on phone: More than three times a week    Gets together: Three times a week    Attends religious service: Not on file    Active member of club or organization: Not on file    Attends meetings of clubs or organizations: Not on file    Relationship status: Not on file  Other Topics Concern  . Not on file  Social History Narrative  . Not on file    Family History  Problem Relation Age of Onset  . Colon cancer Mother   . Hypertension Mother   . Colon cancer Maternal Uncle   . Esophageal cancer Neg Hx   . Rectal cancer Neg Hx   . Stomach cancer Neg Hx     Health Maintenance  Topic Date Due  . HIV Screening  12/31/1979  . TETANUS/TDAP  12/31/1983  . INFLUENZA VACCINE  03/07/2019  . COLONOSCOPY  06/17/2023    ----------------------------------------------------------------------------------------------------------------------------------------------------------------------------------------------------------------- Physical Exam BP (!) 152/94 (BP Location: Left Arm, Patient Position: Sitting, Cuff Size: Normal)   Pulse 76  Temp 97.8 F (36.6 C) (Oral)   Resp 18   Ht 6' 0.75" (1.848 m)   Wt 198 lb (89.8 kg)   SpO2 97%   BMI 26.30 kg/m   Physical Exam Constitutional:      General: He is not in acute distress. HENT:     Head: Normocephalic and atraumatic.     Right Ear: External ear normal.     Left Ear: External ear normal.  Eyes:     General: No scleral icterus. Neck:     Musculoskeletal: Normal range of motion.     Thyroid: No thyromegaly.  Cardiovascular:     Rate and Rhythm: Normal rate and regular rhythm.     Heart sounds: Normal heart sounds.  Pulmonary:     Effort:  Pulmonary effort is normal.     Breath sounds: Normal breath sounds.  Abdominal:     General: Bowel sounds are normal. There is no distension.     Palpations: Abdomen is soft.     Tenderness: There is no abdominal tenderness. There is no guarding.  Lymphadenopathy:     Cervical: No cervical adenopathy.  Skin:    General: Skin is warm and dry.     Findings: No rash.  Neurological:     Mental Status: He is alert and oriented to person, place, and time.     Cranial Nerves: No cranial nerve deficit.     Motor: No abnormal muscle tone.  Psychiatric:        Behavior: Behavior normal.     ------------------------------------------------------------------------------------------------------------------------------------------------------------------------------------------------------------------- Assessment and Plan  Essential hypertension BP elevated initially and on recheck today.  Start benicar 75m daily, recommend low salt diet.  Follow up in 2 months.   Well adult exam Well adult Orders Placed This Encounter  Procedures  . Comp Met (CMET)  . CBC  . Lipid panel  . TSH  . PSA  . Vitamin D (25 hydroxy)  Screenings: lipid and PSA Immunizations:  Unsure of last tdap Anticipatory guidance/Risk factor reduction:  Recommendations per AVS

## 2019-02-16 NOTE — Patient Instructions (Signed)
Preventive Care 40-54 Years Old, Male Preventive care refers to lifestyle choices and visits with your health care provider that can promote health and wellness. This includes:  A yearly physical exam. This is also called an annual well check.  Regular dental and eye exams.  Immunizations.  Screening for certain conditions.  Healthy lifestyle choices, such as eating a healthy diet, getting regular exercise, not using drugs or products that contain nicotine and tobacco, and limiting alcohol use. What can I expect for my preventive care visit? Physical exam Your health care provider will check:  Height and weight. These may be used to calculate body mass index (BMI), which is a measurement that tells if you are at a healthy weight.  Heart rate and blood pressure.  Your skin for abnormal spots. Counseling Your health care provider may ask you questions about:  Alcohol, tobacco, and drug use.  Emotional well-being.  Home and relationship well-being.  Sexual activity.  Eating habits.  Work and work environment. What immunizations do I need?  Influenza (flu) vaccine  This is recommended every year. Tetanus, diphtheria, and pertussis (Tdap) vaccine  You may need a Td booster every 10 years. Varicella (chickenpox) vaccine  You may need this vaccine if you have not already been vaccinated. Zoster (shingles) vaccine  You may need this after age 60. Measles, mumps, and rubella (MMR) vaccine  You may need at least one dose of MMR if you were born in 1957 or later. You may also need a second dose. Pneumococcal conjugate (PCV13) vaccine  You may need this if you have certain conditions and were not previously vaccinated. Pneumococcal polysaccharide (PPSV23) vaccine  You may need one or two doses if you smoke cigarettes or if you have certain conditions. Meningococcal conjugate (MenACWY) vaccine  You may need this if you have certain conditions. Hepatitis A vaccine   You may need this if you have certain conditions or if you travel or work in places where you may be exposed to hepatitis A. Hepatitis B vaccine  You may need this if you have certain conditions or if you travel or work in places where you may be exposed to hepatitis B. Haemophilus influenzae type b (Hib) vaccine  You may need this if you have certain risk factors. Human papillomavirus (HPV) vaccine  If recommended by your health care provider, you may need three doses over 6 months. You may receive vaccines as individual doses or as more than one vaccine together in one shot (combination vaccines). Talk with your health care provider about the risks and benefits of combination vaccines. What tests do I need? Blood tests  Lipid and cholesterol levels. These may be checked every 5 years, or more frequently if you are over 50 years old.  Hepatitis C test.  Hepatitis B test. Screening  Lung cancer screening. You may have this screening every year starting at age 55 if you have a 30-pack-year history of smoking and currently smoke or have quit within the past 15 years.  Prostate cancer screening. Recommendations will vary depending on your family history and other risks.  Colorectal cancer screening. All adults should have this screening starting at age 50 and continuing until age 75. Your health care provider may recommend screening at age 45 if you are at increased risk. You will have tests every 1-10 years, depending on your results and the type of screening test.  Diabetes screening. This is done by checking your blood sugar (glucose) after you have not eaten   for a while (fasting). You may have this done every 1-3 years.  Sexually transmitted disease (STD) testing. Follow these instructions at home: Eating and drinking  Eat a diet that includes fresh fruits and vegetables, whole grains, lean protein, and low-fat dairy products.  Take vitamin and mineral supplements as recommended  by your health care provider.  Do not drink alcohol if your health care provider tells you not to drink.  If you drink alcohol: ? Limit how much you have to 0-2 drinks a day. ? Be aware of how much alcohol is in your drink. In the [place] one drink equals one 12 oz bottle of beer (355 mL), one 5 oz glass of wine (148 mL), or one 1 oz glass of hard liquor (44 mL). Lifestyle  Take daily care of your teeth and gums.  Stay active. Exercise for at least 30 minutes on 5 or more days each week.  Do not use any products that contain nicotine or tobacco, such as cigarettes, e-cigarettes, and chewing tobacco. If you need help quitting, ask your health care provider.  If you are sexually active, practice safe sex. Use a condom or other form of protection to prevent STIs (sexually transmitted infections).  Talk with your health care provider about taking a low-dose aspirin every day starting at age 33. What's next?  Go to your health care provider once a year for a well check visit.  Ask your health care provider how often you should have your eyes and teeth checked.  Stay up to date on all vaccines. This information is not intended to replace advice given to you by your health care provider. Make sure you discuss any questions you have with your health care provider. Document Released: 08/19/2015 Document Revised: 07/17/2018 Document Reviewed: 07/17/2018 Elsevier Patient Education  2020 [employer].

## 2019-02-18 NOTE — Progress Notes (Signed)
-  Mild elevation in cholesterol.  Recommend low fat/low cholesterol diet with regular exercise to improve this.  -Vitamin d is also low. Recommend vitamin d3 2000 IU daily.

## 2019-04-20 ENCOUNTER — Ambulatory Visit: Payer: BC Managed Care – PPO | Admitting: Family Medicine

## 2019-10-08 ENCOUNTER — Telehealth: Payer: Self-pay | Admitting: Family Medicine

## 2019-10-08 NOTE — Telephone Encounter (Signed)
That will be okay

## 2019-10-08 NOTE — Telephone Encounter (Signed)
Appt scheduled for Monday

## 2019-10-08 NOTE — Telephone Encounter (Signed)
pts wife had an appt today and said that Dr Ethelene Hal said that pt needs to get an antibody test before getting vaccine, I was going to set him up with an appt with you but you are booked out a few weeks except for your 11:30 spot, what do you suggest is the best option

## 2019-10-12 ENCOUNTER — Encounter: Payer: Self-pay | Admitting: Family Medicine

## 2019-10-12 ENCOUNTER — Ambulatory Visit (INDEPENDENT_AMBULATORY_CARE_PROVIDER_SITE_OTHER): Payer: BC Managed Care – PPO | Admitting: Family Medicine

## 2019-10-12 ENCOUNTER — Other Ambulatory Visit: Payer: Self-pay

## 2019-10-12 VITALS — BP 164/79 | HR 89 | Temp 96.0°F | Ht 72.0 in | Wt 198.6 lb

## 2019-10-12 DIAGNOSIS — Z20822 Contact with and (suspected) exposure to covid-19: Secondary | ICD-10-CM | POA: Diagnosis not present

## 2019-10-12 DIAGNOSIS — I10 Essential (primary) hypertension: Secondary | ICD-10-CM

## 2019-10-12 NOTE — Patient Instructions (Signed)
Preventing Hypertension Hypertension, commonly called high blood pressure, is when the force of blood pumping through the arteries is too strong. Arteries are blood vessels that carry blood from the heart throughout the body. Over time, hypertension can damage the arteries and decrease blood flow to important parts of the body, including the brain, heart, and kidneys. Often, hypertension does not cause symptoms until blood pressure is very high. For this reason, it is important to have your blood pressure checked on a regular basis. Hypertension can often be prevented with diet and lifestyle changes. If you already have hypertension, you can control it with diet and lifestyle changes, as well as medicine. What nutrition changes can be made? Maintain a healthy diet. This includes:  Eating less salt (sodium). Ask your health care provider how much sodium is safe for you to have. The general recommendation is to consume less than 1 tsp (2,300 mg) of sodium a day. ? Do not add salt to your food. ? Choose low-sodium options when grocery shopping and eating out.  Limiting fats in your diet. You can do this by eating low-fat or fat-free dairy products and by eating less red meat.  Eating more fruits, vegetables, and whole grains. Make a goal to eat: ? 1-2 cups of fresh fruits and vegetables each day. ? 3-4 servings of whole grains each day.  Avoiding foods and beverages that have added sugars.  Eating fish that contain healthy fats (omega-3 fatty acids), such as mackerel or salmon. If you need help putting together a healthy eating plan, try the DASH diet. This diet is high in fruits, vegetables, and whole grains. It is low in sodium, red meat, and added sugars. DASH stands for Dietary Approaches to Stop Hypertension. What lifestyle changes can be made?   Lose weight if you are overweight. Losing just 3?5% of your body weight can help prevent or control hypertension. ? For example, if your present  weight is 200 lb (91 kg), a loss of 3-5% of your weight means losing 6-10 lb (2.7-4.5 kg). ? Ask your health care provider to help you with a diet and exercise plan to safely lose weight.  Get enough exercise. Do at least 150 minutes of moderate-intensity exercise each week. ? You could do this in short exercise sessions several times a day, or you could do longer exercise sessions a few times a week. For example, you could take a brisk 10-minute walk or bike ride, 3 times a day, for 5 days a week.  Find ways to reduce stress, such as exercising, meditating, listening to music, or taking a yoga class. If you need help reducing stress, ask your health care provider.  Do not smoke. This includes e-cigarettes. Chemicals in tobacco and nicotine products raise your blood pressure each time you smoke. If you need help quitting, ask your health care provider.  Avoid alcohol. If you drink alcohol, limit alcohol intake to no more than 1 drink a day for nonpregnant women and 2 drinks a day for men. One drink equals 12 oz of beer, 5 oz of wine, or 1 oz of hard liquor. Why are these changes important? Diet and lifestyle changes can help you prevent hypertension, and they may make you feel better overall and improve your quality of life. If you have hypertension, making these changes will help you control it and help prevent major complications, such as:  Hardening and narrowing of arteries that supply blood to: ? Your heart. This can cause a heart  attack. ? Your brain. This can cause a stroke. ? Your kidneys. This can cause kidney failure.  Stress on your heart muscle, which can cause heart failure. What can I do to lower my risk?  Work with your health care provider to make a hypertension prevention plan that works for you. Follow your plan and keep all follow-up visits as told by your health care provider.  Learn how to check your blood pressure at home. Make sure that you know your personal target  blood pressure, as told by your health care provider. How is this treated? In addition to diet and lifestyle changes, your health care provider may recommend medicines to help lower your blood pressure. You may need to try a few different medicines to find what works best for you. You also may need to take more than one medicine. Take over-the-counter and prescription medicines only as told by your health care provider. Where to find support Your health care provider can help you prevent hypertension and help you keep your blood pressure at a healthy level. Your local hospital or your community may also provide support services and prevention programs. The American Heart Association offers an online support network at: CheapBootlegs.com.cy Where to find more information Learn more about hypertension from:  [place]: ElectronicHangman.is  Centers for Disease Control and Prevention: https://ingram.com/  American Academy of Family Physicians: http://familydoctor.org/familydoctor/en/diseases-conditions/high-blood-pressure.printerview.all.html Learn more about the DASH diet from:  [place] [place]: https://www.reyes.com/ Contact a health care provider if:  You think you are having a reaction to medicines you have taken.  You have recurrent headaches or feel dizzy.  You have swelling in your ankles.  You have trouble with your vision. Summary  Hypertension often does not cause any symptoms until blood pressure is very high. It is important to get your blood pressure checked regularly.  Diet and lifestyle changes are the most important steps in preventing hypertension.  By keeping your blood pressure in a healthy range, you can prevent complications like heart attack, heart failure, stroke, and kidney failure.  Work with your health care  provider to make a hypertension prevention plan that works for you. This information is not intended to replace advice given to you by your health care provider. Make sure you discuss any questions you have with your health care provider. Document Revised: 11/14/2018 Document Reviewed: 04/02/2016 Elsevier Patient Education  [place].  Hypertension, Adult High blood pressure (hypertension) is when the force of blood pumping through the arteries is too strong. The arteries are the blood vessels that carry blood from the heart throughout the body. Hypertension forces the heart to work harder to pump blood and may cause arteries to become narrow or stiff. Untreated or uncontrolled hypertension can cause a heart attack, heart failure, a stroke, kidney disease, and other problems. A blood pressure reading consists of a higher number over a lower number. Ideally, your blood pressure should be below 120/80. The first ("top") number is called the systolic pressure. It is a measure of the pressure in your arteries as your heart beats. The second ("bottom") number is called the diastolic pressure. It is a measure of the pressure in your arteries as the heart relaxes. What are the causes? The exact cause of this condition is not known. There are some conditions that result in or are related to high blood pressure. What increases the risk? Some risk factors for high blood pressure are under your control. The  following factors may make you more likely to develop this condition:  Smoking.  Having type 2 diabetes mellitus, high cholesterol, or both.  Not getting enough exercise or physical activity.  Being overweight.  Having too much fat, sugar, calories, or salt (sodium) in your diet.  Drinking too much alcohol. Some risk factors for high blood pressure may be difficult or impossible to change. Some of these factors include:  Having chronic kidney disease.  Having a family history of high  blood pressure.  Age. Risk increases with age.  Race. You may be at higher risk if you are African American.  Gender. Men are at higher risk than women before age 66. After age 46, women are at higher risk than men.  Having obstructive sleep apnea.  Stress. What are the signs or symptoms? High blood pressure may not cause symptoms. Very high blood pressure (hypertensive crisis) may cause:  Headache.  Anxiety.  Shortness of breath.  Nosebleed.  Nausea and vomiting.  Vision changes.  Severe chest pain.  Seizures. How is this diagnosed? This condition is diagnosed by measuring your blood pressure while you are seated, with your arm resting on a flat surface, your legs uncrossed, and your feet flat on the floor. The cuff of the blood pressure monitor will be placed directly against the skin of your upper arm at the level of your heart. It should be measured at least twice using the same arm. Certain conditions can cause a difference in blood pressure between your right and left arms. Certain factors can cause blood pressure readings to be lower or higher than normal for a short period of time:  When your blood pressure is higher when you are in a health care provider's office than when you are at home, this is called white coat hypertension. Most people with this condition do not need medicines.  When your blood pressure is higher at home than when you are in a health care provider's office, this is called masked hypertension. Most people with this condition may need medicines to control blood pressure. If you have a high blood pressure reading during one visit or you have normal blood pressure with other risk factors, you may be asked to:  Return on a different day to have your blood pressure checked again.  Monitor your blood pressure at home for 1 week or longer. If you are diagnosed with hypertension, you may have other blood or imaging tests to help your health care provider  understand your overall risk for other conditions. How is this treated? This condition is treated by making healthy lifestyle changes, such as eating healthy foods, exercising more, and reducing your alcohol intake. Your health care provider may prescribe medicine if lifestyle changes are not enough to get your blood pressure under control, and if:  Your systolic blood pressure is above 130.  Your diastolic blood pressure is above 80. Your personal target blood pressure may vary depending on your medical conditions, your age, and other factors. Follow these instructions at home: Eating and drinking   Eat a diet that is high in fiber and potassium, and low in sodium, added sugar, and fat. An example eating plan is called the DASH (Dietary Approaches to Stop Hypertension) diet. To eat this way: ? Eat plenty of fresh fruits and vegetables. Try to fill one half of your plate at each meal with fruits and vegetables. ? Eat whole grains, such as whole-wheat pasta, brown rice, or whole-grain bread. Fill about one  fourth of your plate with whole grains. ? Eat or drink low-fat dairy products, such as skim milk or low-fat yogurt. ? Avoid fatty cuts of meat, processed or cured meats, and poultry with skin. Fill about one fourth of your plate with lean proteins, such as fish, chicken without skin, beans, eggs, or tofu. ? Avoid pre-made and processed foods. These tend to be higher in sodium, added sugar, and fat.  Reduce your daily sodium intake. Most people with hypertension should eat less than 1,500 mg of sodium a day.  Do not drink alcohol if: ? Your health care provider tells you not to drink. ? You are pregnant, may be pregnant, or are planning to become pregnant.  If you drink alcohol: ? Limit how much you use to:  0-1 drink a day for women.  0-2 drinks a day for men. ? Be aware of how much alcohol is in your drink. In the [place] one drink equals one 12 oz bottle of beer (355 mL), one 5 oz  glass of wine (148 mL), or one 1 oz glass of hard liquor (44 mL). Lifestyle   Work with your health care provider to maintain a healthy body weight or to lose weight. Ask what an ideal weight is for you.  Get at least 30 minutes of exercise most days of the week. Activities may include walking, swimming, or biking.  Include exercise to strengthen your muscles (resistance exercise), such as Pilates or lifting weights, as part of your weekly exercise routine. Try to do these types of exercises for 30 minutes at least 3 days a week.  Do not use any products that contain nicotine or tobacco, such as cigarettes, e-cigarettes, and chewing tobacco. If you need help quitting, ask your health care provider.  Monitor your blood pressure at home as told by your health care provider.  Keep all follow-up visits as told by your health care provider. This is important. Medicines  Take over-the-counter and prescription medicines only as told by your health care provider. Follow directions carefully. Blood pressure medicines must be taken as prescribed.  Do not skip doses of blood pressure medicine. Doing this puts you at risk for problems and can make the medicine less effective.  Ask your health care provider about side effects or reactions to medicines that you should watch for. Contact a health care provider if you:  Think you are having a reaction to a medicine you are taking.  Have headaches that keep coming back (recurring).  Feel dizzy.  Have swelling in your ankles.  Have trouble with your vision. Get help right away if you:  Develop a severe headache or confusion.  Have unusual weakness or numbness.  Feel faint.  Have severe pain in your chest or abdomen.  Vomit repeatedly.  Have trouble breathing. Summary  Hypertension is when the force of blood pumping through your arteries is too strong. If this condition is not controlled, it may put you at risk for serious  complications.  Your personal target blood pressure may vary depending on your medical conditions, your age, and other factors. For most people, a normal blood pressure is less than 120/80.  Hypertension is treated with lifestyle changes, medicines, or a combination of both. Lifestyle changes include losing weight, eating a healthy, low-sodium diet, exercising more, and limiting alcohol. This information is not intended to replace advice given to you by your health care provider. Make sure you discuss any questions you have with your health care provider.  Document Revised: 04/02/2018 Document Reviewed: 04/02/2018 Elsevier Patient Education  [place] Stress Reduction Mindfulness-based stress reduction (MBSR) is a program that helps people learn to practice mindfulness. Mindfulness is the practice of intentionally paying attention to the present moment. It can be learned and practiced through techniques such as education, breathing exercises, meditation, and yoga. MBSR includes several mindfulness techniques in one program. MBSR works best when you understand the treatment, are willing to try new things, and can commit to spending time practicing what you learn. MBSR training may include learning about:  How your emotions, thoughts, and reactions affect your body.  New ways to respond to things that cause negative thoughts to start (triggers).  How to notice your thoughts and let go of them.  Practicing awareness of everyday things that you normally do without thinking.  The techniques and goals of different types of meditation. What are the benefits of MBSR? MBSR can have many benefits, which include helping you to:  Develop self-awareness. This refers to knowing and understanding yourself.  Learn skills and attitudes that help you to participate in your own health care.  Learn new ways to care for yourself.  Be more accepting about how things are, and let  things go.  Be less judgmental and approach things with an open mind.  Be patient with yourself and trust yourself more. MBSR has also been shown to:  Reduce negative emotions, such as depression and anxiety.  Improve memory and focus.  Change how you sense and approach pain.  Boost your body's ability to fight infections.  Help you connect better with other people.  Improve your sense of well-being. Follow these instructions at home:   Find a local in-person or online MBSR program.  Set aside some time regularly for mindfulness practice.  Find a mindfulness practice that works best for you. This may include one or more of the following: ? Meditation. Meditation involves focusing your mind on a certain thought or activity. ? Breathing awareness exercises. These help you to stay present by focusing on your breath. ? Body scan. For this practice, you lie down and pay attention to each part of your body from head to toe. You can identify tension and soreness and intentionally relax parts of your body. ? Yoga. Yoga involves stretching and breathing, and it can improve your ability to move and be flexible. It can also provide an experience of testing your body's limits, which can help you release stress. ? Mindful eating. This way of eating involves focusing on the taste, texture, color, and smell of each bite of food. Because this slows down eating and helps you feel full sooner, it can be an important part of a weight-loss plan.  Find a podcast or recording that provides guidance for breathing awareness, body scan, or meditation exercises. You can listen to these any time when you have a free moment to rest without distractions.  Follow your treatment plan as told by your health care provider. This may include taking regular medicines and making changes to your diet or lifestyle as recommended. How to practice mindfulness To do a basic awareness exercise:  Find a comfortable place  to sit.  Pay attention to the present moment. Observe your thoughts, feelings, and surroundings just as they are.  Avoid placing judgment on yourself, your feelings, or your surroundings. Make note of any judgment that comes up, and let it go.  Your mind may wander, and that is okay. Make note  of when your thoughts drift, and return your attention to the present moment. To do basic mindfulness meditation:  Find a comfortable place to sit. This may include a stable chair or a firm floor cushion. ? Sit upright with your back straight. Let your arms fall next to your side with your hands resting on your legs. ? If sitting in a chair, rest your feet flat on the floor. ? If sitting on a cushion, cross your legs in front of you.  Keep your head in a neutral position with your chin dropped slightly. Relax your jaw and rest the tip of your tongue on the roof of your mouth. Drop your gaze to the floor. You can close your eyes if you like.  Breathe normally and pay attention to your breath. Feel the air moving in and out of your nose. Feel your belly expanding and relaxing with each breath.  Your mind may wander, and that is okay. Make note of when your thoughts drift, and return your attention to your breath.  Avoid placing judgment on yourself, your feelings, or your surroundings. Make note of any judgment or feelings that come up, let them go, and bring your attention back to your breath.  When you are ready, lift your gaze or open your eyes. Pay attention to how your body feels after the meditation. Where to find more information You can find more information about MBSR from:  Your health care provider.  Community-based meditation centers or programs.  Programs offered near you. Summary  Mindfulness-based stress reduction (MBSR) is a program that teaches you how to intentionally pay attention to the present moment. It is used with other treatments to help you cope better with daily stress,  emotions, and pain.  MBSR focuses on developing self-awareness, which allows you to respond to life stress without judgment or negative emotions.  MBSR programs may involve learning different mindfulness practices, such as breathing exercises, meditation, yoga, body scan, or mindful eating. Find a mindfulness practice that works best for you, and set aside time for it on a regular basis. This information is not intended to replace advice given to you by your health care provider. Make sure you discuss any questions you have with your health care provider. Document Revised: 07/05/2017 Document Reviewed: 11/29/2016 Elsevier Patient Education  [place].

## 2019-10-12 NOTE — Progress Notes (Signed)
Established Patient Office Visit  Subjective:  Patient ID: Andrew Houston, male    DOB: Jul 16, 1966  Age: 54 y.o. MRN: [id number]  CC:  Chief Complaint  Patient presents with  . Transitions Of Care    pt here to discuss covid vaccine     HPI Andrew Houston presents for follow-up of his blood pressure and concerns about recent Covid exposure and he is need for the vaccine.  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  His wife recently had Covid.  As far as they know he has been her only exposure to the illness.  He himself is never had any symptoms but multiple employees and customers that he has been exposed to have had the illness.  Patient is also been measuring his blood pressure at home and it has been elevated.  Past Medical History:  Diagnosis Date  . Hypertension     Past Surgical History:  Procedure Laterality Date  . TONSILLECTOMY    . Tubes in ears      Family History  Problem Relation Age of Onset  . Colon cancer Mother   . Hypertension Mother   . Colon cancer Maternal Uncle   . Esophageal cancer Neg Hx   . Rectal cancer Neg Hx   . Stomach cancer Neg Hx     Social History   Socioeconomic History  . Marital status: Married    Spouse name: Not on file  . Number of children: Not on file  . Years of education: Not on file  . Highest education level: Not on file  Occupational History  . Not on file  Tobacco Use  . Smoking status: Never Smoker  . Smokeless tobacco: Current User    Types: Chew  Substance and Sexual Activity  . Alcohol use: Yes    Comment: weekly beer  . Drug use: Never  . Sexual activity: Yes  Other Topics Concern  . Not on file  Social History Narrative  . Not on file   Social Determinants of Health   Financial Resource Strain: Low Risk   . Difficulty of Paying Living Expenses: Not very hard  Food Insecurity: No Food Insecurity  . Worried About Charity fundraiser in the Last Year: Never true  . Ran Out of Food in the  Last Year: Never true  Transportation Needs: No Transportation Needs  . Lack of Transportation (Medical): No  . Lack of Transportation (Non-Medical): No  Physical Activity: Sufficiently Active  . Days of Exercise per Week: 5 days  . Minutes of Exercise per Session: 60 min  Stress: No Stress Concern Present  . Feeling of Stress : Only a little  Social Connections: Unknown  . Frequency of Communication with Friends and Family: More than three times a week  . Frequency of Social Gatherings with Friends and Family: Three times a week  . Attends Religious Services: Not on file  . Active Member of Clubs or Organizations: Not on file  . Attends Archivist Meetings: Not on file  . Marital Status: Not on file  Intimate Partner Violence: Not At Risk  . Fear of Current or Ex-Partner: No  . Emotionally Abused: No  . Physically Abused: No  . Sexually Abused: No    Outpatient Medications Prior to Visit  Medication Sig Dispense Refill  . Multiple Vitamin (MULTIVITAMIN) tablet Take 1 tablet by mouth daily.    Marland Kitchen olmesartan (BENICAR) 20 MG tablet Take 0.5 tablets (10 mg total)  by mouth daily. (Patient not taking: Reported on 10/12/2019) 90 tablet 0   No facility-administered medications prior to visit.    No Known Allergies  ROS Review of Systems  Constitutional: Negative.   HENT: Negative.   Eyes: Negative for photophobia and visual disturbance.  Respiratory: Negative.   Cardiovascular: Negative.   Gastrointestinal: Negative.   Endocrine: Negative for polyphagia and polyuria.  Musculoskeletal: Negative for gait problem and myalgias.  Neurological: Negative for headaches.  Psychiatric/Behavioral: Negative.       Objective:    Physical Exam  Constitutional: He is oriented to person, place, and time. He appears well-developed and well-nourished. No distress.  HENT:  Head: Normocephalic and atraumatic.  Right Ear: External ear normal.  Left Ear: External ear normal.  Eyes:  Conjunctivae are normal. Right eye exhibits no discharge. Left eye exhibits no discharge. No scleral icterus.  Neck: No JVD present. No tracheal deviation present. No thyromegaly present.  Cardiovascular: Normal rate, regular rhythm and normal heart sounds.  Pulmonary/Chest: Effort normal and breath sounds normal. No stridor.  Lymphadenopathy:    He has no cervical adenopathy.  Neurological: He is alert and oriented to person, place, and time.  Skin: Skin is warm and dry. He is not diaphoretic.  Psychiatric: He has a normal mood and affect. His behavior is normal.    BP (!) 164/79   Pulse 89   Temp (!) 96 F (35.6 C) (Tympanic)   Ht 6' (1.829 m)   Wt 198 lb 9.6 oz (90.1 kg)   SpO2 97%   BMI 26.94 kg/m  Wt Readings from Last 3 Encounters:  10/12/19 198 lb 9.6 oz (90.1 kg)  02/16/19 198 lb (89.8 kg)  10/13/18 203 lb 6.4 oz (92.3 kg)     Health Maintenance Due  Topic Date Due  . HIV Screening  12/31/1979  . TETANUS/TDAP  12/31/1983  . INFLUENZA VACCINE  03/07/2019    There are no preventive care reminders to display for this patient.  Lab Results  Component Value Date   TSH 1.16 02/16/2019   Lab Results  Component Value Date   WBC 5.2 02/16/2019   HGB 14.8 02/16/2019   HCT 43.5 02/16/2019   MCV 93.5 02/16/2019   PLT 167.0 02/16/2019   Lab Results  Component Value Date   NA 138 02/16/2019   K 4.1 02/16/2019   CO2 24 02/16/2019   GLUCOSE 97 02/16/2019   BUN 17 02/16/2019   CREATININE 0.94 02/16/2019   BILITOT 0.5 02/16/2019   ALKPHOS 49 02/16/2019   AST 17 02/16/2019   ALT 17 02/16/2019   PROT 6.7 02/16/2019   ALBUMIN 4.5 02/16/2019   CALCIUM 9.2 02/16/2019   GFR 83.59 02/16/2019   Lab Results  Component Value Date   CHOL 178 02/16/2019   Lab Results  Component Value Date   HDL 43.00 02/16/2019   Lab Results  Component Value Date   LDLCALC 108 (H) 02/16/2019   Lab Results  Component Value Date   TRIG 135.0 02/16/2019   Lab Results    Component Value Date   CHOLHDL 4 02/16/2019   No results found for: HGBA1C    Assessment & Plan:   Problem List Items Addressed This Visit      Cardiovascular and Mediastinum   Essential hypertension    Other Visit Diagnoses    Close exposure to COVID-19 virus    -  Primary   Relevant Orders   SAR CoV2 Serology (COVID 19)AB(IGG)IA  No orders of the defined types were placed in this encounter.   Follow-up: Return in about 3 months (around 01/12/2020).  Encouraged patient to go ahead and start the olmesartan at 20 mg.  Asked him to follow-up in 3 months.  He was given information on hypertension and preventing hypertension as well as mindful this based stress reduction.  Libby Maw, MD

## 2019-10-13 LAB — SAR COV2 SEROLOGY (COVID19)AB(IGG),IA: SARS CoV2 AB IGG: POSITIVE — AB

## 2019-12-04 ENCOUNTER — Ambulatory Visit: Payer: BC Managed Care – PPO | Attending: Internal Medicine

## 2019-12-04 DIAGNOSIS — Z23 Encounter for immunization: Secondary | ICD-10-CM

## 2019-12-04 NOTE — Progress Notes (Signed)
   Covid-19 Vaccination Clinic  Name:  Hamish Trayer    MRN: [id number] DOB: 1966/04/29  12/04/2019  Mr. Sheetz was observed post Covid-19 immunization for 15 minutes without incident. He was provided with Vaccine Information Sheet and instruction to access the V-Safe system.   Mr. Macnab was instructed to call 911 with any severe reactions post vaccine: Marland Kitchen Difficulty breathing  . Swelling of face and throat  . A fast heartbeat  . A bad rash all over body  . Dizziness and weakness   Immunizations Administered    Name Date Dose VIS Date Route   Pfizer COVID-19 Vaccine 12/04/2019  4:24 PM 0.3 mL 09/30/2018 Intramuscular   Manufacturer: [place]   Lot: [id number]   [place]: [id number]

## 2019-12-28 ENCOUNTER — Ambulatory Visit: Payer: BC Managed Care – PPO

## 2020-01-05 ENCOUNTER — Other Ambulatory Visit: Payer: Self-pay

## 2020-01-05 ENCOUNTER — Telehealth: Payer: Self-pay | Admitting: Family Medicine

## 2020-01-05 DIAGNOSIS — I10 Essential (primary) hypertension: Secondary | ICD-10-CM

## 2020-01-05 MED ORDER — OLMESARTAN MEDOXOMIL 20 MG PO TABS: 10.0000 mg | ORAL_TABLET | Freq: Every day | ORAL | 0 refills | Status: DC

## 2020-01-05 NOTE — Telephone Encounter (Signed)
Patient is calling and requesting a refill for Olmesartan sent [facility] on [address]. CB is [phone]

## 2020-01-05 NOTE — Telephone Encounter (Signed)
Rx sent today.

## 2020-01-05 NOTE — Telephone Encounter (Signed)
Last OV 10/12/19 Last fill 02/16/19  #90/0

## 2020-01-08 NOTE — Telephone Encounter (Signed)
Patient is requesting corrected prescription. He states at his last appt, Dr. Ethelene Hal instructed him to take 1 pill per day. RX just refilled says 0.5 pill per day and insurance is denying refill as too soon.

## 2020-01-13 NOTE — Telephone Encounter (Signed)
WK-Plz confirm if Olmesartan should be 1/2qd or 1qd as pt has been taking as 1qd and due to current SIG ins is not covering/plz advise/thx dmf

## 2020-01-14 MED ORDER — OLMESARTAN MEDOXOMIL 20 MG PO TABS: 20.0000 mg | ORAL_TABLET | Freq: Every day | ORAL | 0 refills | Status: DC

## 2020-01-14 NOTE — Addendum Note (Signed)
Addended by: Abelino Derrick A on: 01/14/2020 08:00 AM   Modules accepted: Orders

## 2020-01-18 ENCOUNTER — Other Ambulatory Visit: Payer: Self-pay

## 2020-01-18 ENCOUNTER — Encounter: Payer: Self-pay | Admitting: Family Medicine

## 2020-01-18 ENCOUNTER — Ambulatory Visit: Payer: BC Managed Care – PPO | Admitting: Family Medicine

## 2020-01-18 VITALS — BP 136/84 | HR 77 | Temp 98.1°F | Ht 72.0 in | Wt 197.8 lb

## 2020-01-18 DIAGNOSIS — E559 Vitamin D deficiency, unspecified: Secondary | ICD-10-CM | POA: Diagnosis not present

## 2020-01-18 DIAGNOSIS — Z1159 Encounter for screening for other viral diseases: Secondary | ICD-10-CM | POA: Diagnosis not present

## 2020-01-18 DIAGNOSIS — I10 Essential (primary) hypertension: Secondary | ICD-10-CM

## 2020-01-18 DIAGNOSIS — Z23 Encounter for immunization: Secondary | ICD-10-CM | POA: Diagnosis not present

## 2020-01-18 DIAGNOSIS — Z0000 Encounter for general adult medical examination without abnormal findings: Secondary | ICD-10-CM

## 2020-01-18 LAB — URINALYSIS, ROUTINE W REFLEX MICROSCOPIC
Bilirubin Urine: NEGATIVE
Hgb urine dipstick: NEGATIVE
Ketones, ur: NEGATIVE
Leukocytes,Ua: NEGATIVE
Nitrite: NEGATIVE
RBC / HPF: NONE SEEN (ref 0–?)
Specific Gravity, Urine: 1.015 (ref 1.000–1.030)
Total Protein, Urine: NEGATIVE
Urine Glucose: NEGATIVE
Urobilinogen, UA: 0.2 (ref 0.0–1.0)
pH: 5.5 (ref 5.0–8.0)

## 2020-01-18 LAB — COMPREHENSIVE METABOLIC PANEL
ALT: 21 U/L (ref 0–53)
AST: 22 U/L (ref 0–37)
Albumin: 4.3 g/dL (ref 3.5–5.2)
Alkaline Phosphatase: 60 U/L (ref 39–117)
BUN: 16 mg/dL (ref 6–23)
CO2: 26 mEq/L (ref 19–32)
Calcium: 9.4 mg/dL (ref 8.4–10.5)
Chloride: 104 mEq/L (ref 96–112)
Creatinine, Ser: 0.97 mg/dL (ref 0.40–1.50)
GFR: 80.34 mL/min (ref >60.00)
Glucose, Bld: 110 mg/dL — ABNORMAL HIGH (ref 70–99)
Potassium: 4.3 mEq/L (ref 3.5–5.1)
Sodium: 137 mEq/L (ref 135–145)
Total Bilirubin: 0.4 mg/dL (ref 0.2–1.2)
Total Protein: 6.8 g/dL (ref 6.0–8.3)

## 2020-01-18 LAB — LIPID PANEL
Cholesterol: 183 mg/dL (ref 0–200)
HDL: 48 mg/dL (ref >39.00)
LDL Cholesterol: 120 mg/dL — ABNORMAL HIGH (ref 0–99)
NonHDL: 134.97
Total CHOL/HDL Ratio: 4
Triglycerides: 73 mg/dL (ref 0.0–149.0)
VLDL: 14.6 mg/dL (ref 0.0–40.0)

## 2020-01-18 LAB — PSA: PSA: 0.33 ng/mL (ref 0.10–4.00)

## 2020-01-18 LAB — CBC
HCT: 41.2 % (ref 39.0–52.0)
Hemoglobin: 14.2 g/dL (ref 13.0–17.0)
MCHC: 34.5 g/dL (ref 30.0–36.0)
MCV: 93.7 fl (ref 78.0–100.0)
Platelets: 158 10*3/uL (ref 150.0–400.0)
RBC: 4.4 Mil/uL (ref 4.22–5.81)
RDW: 13 % (ref 11.5–15.5)
WBC: 4.3 10*3/uL (ref 4.0–10.5)

## 2020-01-18 LAB — VITAMIN D 25 HYDROXY (VIT D DEFICIENCY, FRACTURES): VITD: 42.51 ng/mL (ref 30.00–100.00)

## 2020-01-18 MED ORDER — OLMESARTAN MEDOXOMIL 20 MG PO TABS: 20.0000 mg | ORAL_TABLET | Freq: Every day | ORAL | 1 refills | Status: DC

## 2020-01-18 MED ORDER — OLMESARTAN MEDOXOMIL 20 MG PO TABS: 20.0000 mg | ORAL_TABLET | Freq: Every day | ORAL | 2 refills | Status: DC

## 2020-01-18 NOTE — Progress Notes (Addendum)
Established Patient Office Visit  Subjective:  Patient ID: Andrew Houston, male    DOB: [birth year]  Age: 54 y.o. MRN: [id number]  CC:  Chief Complaint  Patient presents with  . Follow-up    Patient is here today for a follow-up with HTN.  He currently takes Benecar 20mg  1qd and is compliant. Gets an occasional reading which is WNL. Has been exercising for a while as well. He is currently fasting.    HPI Andrew Houston presents for yearly physical exam and follow-up of his blood pressure.  Blood pressures been well controlled with the 20 mg of Benicar.  No issues with the medication.  Exercising at least 3 times weekly.  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  He and his wife have both had Covid and had the vaccine.  Mom had colon cancer in her early 5s and patient has been screened twice now.  Past Medical History:  Diagnosis Date  . Hypertension     Past Surgical History:  Procedure Laterality Date  . TONSILLECTOMY    . Tubes in ears      Family History  Problem Relation Age of Onset  . Colon cancer Mother   . Hypertension Mother   . Colon cancer Maternal Uncle   . Esophageal cancer Neg Hx   . Rectal cancer Neg Hx   . Stomach cancer Neg Hx     Social History   Socioeconomic History  . Marital status: Married    Spouse name: Not on file  . Number of children: Not on file  . Years of education: Not on file  . Highest education level: Not on file  Occupational History  . Not on file  Tobacco Use  . Smoking status: Never Smoker  . Smokeless tobacco: Current User    Types: Chew  Vaping Use  . Vaping Use: Never used  Substance and Sexual Activity  . Alcohol use: Yes    Comment: weekly beer  . Drug use: Never  . Sexual activity: Yes  Other Topics Concern  . Not on file  Social History Narrative  . Not on file   Social Determinants of Health   Financial Resource Strain: Low Risk   . Difficulty of Paying Living Expenses: Not very hard  Food  Insecurity: No Food Insecurity  . Worried About Charity fundraiser in the Last Year: Never true  . Ran Out of Food in the Last Year: Never true  Transportation Needs: No Transportation Needs  . Lack of Transportation (Medical): No  . Lack of Transportation (Non-Medical): No  Physical Activity: Sufficiently Active  . Days of Exercise per Week: 5 days  . Minutes of Exercise per Session: 60 min  Stress: No Stress Concern Present  . Feeling of Stress : Only a little  Social Connections: Unknown  . Frequency of Communication with Friends and Family: More than three times a week  . Frequency of Social Gatherings with Friends and Family: Three times a week  . Attends Religious Services: Not on file  . Active Member of Clubs or Organizations: Not on file  . Attends Archivist Meetings: Not on file  . Marital Status: Not on file  Intimate Partner Violence: Not At Risk  . Fear of Current or Ex-Partner: No  . Emotionally Abused: No  . Physically Abused: No  . Sexually Abused: No    Outpatient Medications Prior to Visit  Medication Sig Dispense Refill  . Cholecalciferol (VITAMIN  D) 50 MCG (2000 UT) tablet Take 2,000 Units by mouth daily.    . Multiple Vitamin (MULTIVITAMIN) tablet Take 1 tablet by mouth daily.    Marland Kitchen olmesartan (BENICAR) 20 MG tablet Take 1 tablet (20 mg total) by mouth daily. 90 tablet 0   No facility-administered medications prior to visit.    No Known Allergies  ROS Review of Systems  Constitutional: Negative.   HENT: Negative.   Eyes: Negative for photophobia and visual disturbance.  Respiratory: Negative.   Cardiovascular: Negative.   Gastrointestinal: Negative.   Endocrine: Negative for polyphagia and polyuria.  Genitourinary: Negative for difficulty urinating, frequency and urgency.  Musculoskeletal: Negative for gait problem and joint swelling.  Skin: Negative for pallor and rash.  Allergic/Immunologic: Negative for immunocompromised state.    Neurological: Negative for light-headedness and numbness.  Hematological: Does not bruise/bleed easily.  Psychiatric/Behavioral: Negative.       Objective:    Physical Exam Vitals and nursing note reviewed.  Constitutional:      General: He is not in acute distress.    Appearance: Normal appearance. He is normal weight. He is not ill-appearing, toxic-appearing or diaphoretic.  HENT:     Head: Normocephalic and atraumatic.     Right Ear: Tympanic membrane, ear canal and external ear normal.     Left Ear: Tympanic membrane, ear canal and external ear normal.     Mouth/Throat:     Mouth: Mucous membranes are dry.     Pharynx: Oropharynx is clear. No oropharyngeal exudate or posterior oropharyngeal erythema.  Eyes:     General: No scleral icterus.       Right eye: No discharge.        Left eye: No discharge.     Extraocular Movements: Extraocular movements intact.     Conjunctiva/sclera: Conjunctivae normal.     Pupils: Pupils are equal, round, and reactive to light.  Cardiovascular:     Rate and Rhythm: Normal rate and regular rhythm.     Pulses: Normal pulses.     Heart sounds: Normal heart sounds.  Pulmonary:     Effort: Pulmonary effort is normal.     Breath sounds: Normal breath sounds.  Abdominal:     General: Abdomen is flat. Bowel sounds are normal. There is no distension.     Palpations: Abdomen is soft. There is no mass.     Tenderness: There is no abdominal tenderness. There is no guarding or rebound.     Hernia: No hernia is present.  Genitourinary:    Prostate: Enlarged. Not tender and no nodules present.     Rectum: Guaiac result negative. No mass, tenderness, anal fissure, external hemorrhoid or internal hemorrhoid. Normal anal tone.  Musculoskeletal:     Cervical back: Normal range of motion and neck supple. No rigidity or tenderness.     Right lower leg: No edema.     Left lower leg: No edema.  Lymphadenopathy:     Cervical: No cervical adenopathy.   Skin:    General: Skin is warm and dry.  Neurological:     Mental Status: He is alert and oriented to person, place, and time.  Psychiatric:        Mood and Affect: Mood normal.        Behavior: Behavior normal.     BP 136/84 (BP Location: Left Arm, Patient Position: Sitting, Cuff Size: Normal)   Pulse 77   Temp 98.1 F (36.7 C) (Temporal)   Ht 6' (1.829 m)  Wt 197 lb 12.8 oz (89.7 kg)   SpO2 96%   BMI 26.83 kg/m  Wt Readings from Last 3 Encounters:  01/18/20 197 lb 12.8 oz (89.7 kg)  10/12/19 198 lb 9.6 oz (90.1 kg)  02/16/19 198 lb (89.8 kg)     There are no preventive care reminders to display for this patient.  There are no preventive care reminders to display for this patient.  Lab Results  Component Value Date   TSH 1.16 02/16/2019   Lab Results  Component Value Date   WBC 4.3 01/18/2020   HGB 14.2 01/18/2020   HCT 41.2 01/18/2020   MCV 93.7 01/18/2020   PLT 158.0 01/18/2020   Lab Results  Component Value Date   NA 137 01/18/2020   K 4.3 01/18/2020   CO2 26 01/18/2020   GLUCOSE 110 (H) 01/18/2020   BUN 16 01/18/2020   CREATININE 0.97 01/18/2020   BILITOT 0.4 01/18/2020   ALKPHOS 60 01/18/2020   AST 22 01/18/2020   ALT 21 01/18/2020   PROT 6.8 01/18/2020   ALBUMIN 4.3 01/18/2020   CALCIUM 9.4 01/18/2020   GFR 80.34 01/18/2020   Lab Results  Component Value Date   CHOL 183 01/18/2020   Lab Results  Component Value Date   HDL 48.00 01/18/2020   Lab Results  Component Value Date   LDLCALC 120 (H) 01/18/2020   Lab Results  Component Value Date   TRIG 73.0 01/18/2020   Lab Results  Component Value Date   CHOLHDL 4 01/18/2020   No results found for: HGBA1C    The 10-year ASCVD risk score Mikey Bussing DC Jr., et al., 2013) is: 6.8%   Values used to calculate the score:     Age: 29 years     Sex: Male     Is Non-Hispanic African American: No     Diabetic: No     Tobacco smoker: No     Systolic Blood Pressure: 962 mmHg     Is BP  treated: Yes     HDL Cholesterol: 48 mg/dL     Total Cholesterol: 183 mg/dL Assessment & Plan:   Problem List Items Addressed This Visit      Cardiovascular and Mediastinum   Essential hypertension   Relevant Medications   olmesartan (BENICAR) 20 MG tablet   Other Relevant Orders   CBC (Completed)   Comprehensive metabolic panel (Completed)   Urinalysis, Routine w reflex microscopic (Completed)     Other   Healthcare maintenance - Primary   Relevant Orders   Hepatitis C antibody (Completed)   HIV Antibody (routine testing w rflx) (Completed)   Lipid panel (Completed)   PSA (Completed)   Urinalysis, Routine w reflex microscopic (Completed)   Tdap vaccine greater than or equal to 7yo IM (Completed)   Vitamin D deficiency   Relevant Orders   VITAMIN D 25 Hydroxy (Vit-D Deficiency, Fractures) (Completed)    Other Visit Diagnoses    Need for Tdap vaccination       Relevant Orders   Tdap vaccine greater than or equal to 7yo IM (Completed)      Meds ordered this encounter  Medications  . DISCONTD: olmesartan (BENICAR) 20 MG tablet    Sig: Take 1 tablet (20 mg total) by mouth daily.    Dispense:  90 tablet    Refill:  1  . olmesartan (BENICAR) 20 MG tablet    Sig: Take 1 tablet (20 mg total) by mouth daily.    Dispense:  90 tablet    Refill:  2    Follow-up: Return in about 6 months (around 07/19/2020).   Given information on health maintenance and disease prevention.  Encouraged him to continue his healthy lifestyle. Libby Maw, MD

## 2020-01-18 NOTE — Patient Instructions (Signed)
Health Maintenance, Male Adopting a healthy lifestyle and getting preventive care are important in promoting health and wellness. Ask your health care provider about:  The right schedule for you to have regular tests and exams.  Things you can do on your own to prevent diseases and keep yourself healthy. What should I know about diet, weight, and exercise? Eat a healthy diet   Eat a diet that includes plenty of vegetables, fruits, low-fat dairy products, and lean protein.  Do not eat a lot of foods that are high in solid fats, added sugars, or sodium. Maintain a healthy weight Body mass index (BMI) is a measurement that can be used to identify possible weight problems. It estimates body fat based on height and weight. Your health care provider can help determine your BMI and help you achieve or maintain a healthy weight. Get regular exercise Get regular exercise. This is one of the most important things you can do for your health. Most adults should:  Exercise for at least 150 minutes each week. The exercise should increase your heart rate and make you sweat (moderate-intensity exercise).  Do strengthening exercises at least twice a week. This is in addition to the moderate-intensity exercise.  Spend less time sitting. Even light physical activity can be beneficial. Watch cholesterol and blood lipids Have your blood tested for lipids and cholesterol at 55 years of age, then have this test every 5 years. You may need to have your cholesterol levels checked more often if:  Your lipid or cholesterol levels are high.  You are older than 55 years of age.  You are at high risk for heart disease. What should I know about cancer screening? Many types of cancers can be detected early and may often be prevented. Depending on your health history and family history, you may need to have cancer screening at various ages. This may include screening for:  Colorectal cancer.  Prostate  cancer.  Skin cancer.  Lung cancer. What should I know about heart disease, diabetes, and high blood pressure? Blood pressure and heart disease  High blood pressure causes heart disease and increases the risk of stroke. This is more likely to develop in people who have high blood pressure readings, are of African descent, or are overweight.  Talk with your health care provider about your target blood pressure readings.  Have your blood pressure checked: ? Every 3-5 years if you are 18-39 years of age. ? Every year if you are 55 years old or older.  If you are between the ages of 55 and 75 and are a current or former smoker, ask your health care provider if you should have a one-time screening for abdominal aortic aneurysm (AAA). Diabetes Have regular diabetes screenings. This checks your fasting blood sugar level. Have the screening done:  Once every three years after age 55 if you are at a normal weight and have a low risk for diabetes.  More often and at a younger age if you overweight or have a high risk for diabetes. are overweight or have a high risk for diabetes. What should I know about preventing infection? Hepatitis B If you have a higher risk for hepatitis B, you should be screened for this virus. Talk with your health care provider to find out if you are at risk for hepatitis B infection. Hepatitis C Blood testing is recommended for:  Everyone born from 1945 through 1965.  Anyone with known risk factors for hepatitis C. Sexually transmitted infections (STIs)  You should be screened each year   for STIs, including gonorrhea and chlamydia, if: ? You are sexually active and are younger than 55 years of age. ? You are older than 55 years of age and your health care provider tells you that you are at risk for this type of infection. ? Your sexual activity has changed since you were last screened, and you are at increased risk for chlamydia or gonorrhea. Ask your health care provider if you are at risk.  Ask your  health care provider about whether you are at high risk for HIV. Your health care provider may recommend a prescription medicine to help prevent HIV infection. If you choose to take medicine to prevent HIV, you should first get tested for HIV. You should then be tested every 3 months for as long as you are taking the medicine. Follow these instructions at home: Lifestyle  Do not use any products that contain nicotine or tobacco, such as cigarettes, e-cigarettes, and chewing tobacco. If you need help quitting, ask your health care provider.  Do not use street drugs.  Do not share needles.  Ask your health care provider for help if you need support or information about quitting drugs. Alcohol use  Do not drink alcohol if your health care provider tells you not to drink.  If you drink alcohol: ? Limit how much you have to 0-2 drinks a day. ? Be aware of how much alcohol is in your drink. In the [place] one drink equals one 12 oz bottle of beer (355 mL), one 5 oz glass of wine (148 mL), or one 1 oz glass of hard liquor (44 mL). General instructions  Schedule regular health, dental, and eye exams.  Stay current with your vaccines.  Tell your health care provider if: ? You often feel depressed. ? You have ever been abused or do not feel safe at home. Summary  Adopting a healthy lifestyle and getting preventive care are important in promoting health and wellness.  Follow your health care provider's instructions about healthy diet, exercising, and getting tested or screened for diseases.  Follow your health care provider's instructions on monitoring your cholesterol and blood pressure. This information is not intended to replace advice given to you by your health care provider. Make sure you discuss any questions you have with your health care provider. Document Revised: 07/16/2018 Document Reviewed: 07/16/2018 [employer] Patient Education  2020 [employer]  Preventive Care 55-64 Years  Old, Male Preventive care refers to lifestyle choices and visits with your health care provider that can promote health and wellness. This includes:  A yearly physical exam. This is also called an annual well check.  Regular dental and eye exams.  Immunizations.  Screening for certain conditions.  Healthy lifestyle choices, such as eating a healthy diet, getting regular exercise, not using drugs or products that contain nicotine and tobacco, and limiting alcohol use. What can I expect for my preventive care visit? Physical exam Your health care provider will check:  Height and weight. These may be used to calculate body mass index (BMI), which is a measurement that tells if you are at a healthy weight.  Heart rate and blood pressure.  Your skin for abnormal spots. Counseling Your health care provider may ask you questions about:  Alcohol, tobacco, and drug use.  Emotional well-being.  Home and relationship well-being.  Sexual activity.  Eating habits.  Work and work environment. What immunizations do I need?  Influenza (flu) vaccine  This is recommended every year. Tetanus, diphtheria,   and pertussis (Tdap) vaccine  You may need a Td booster every 10 years. Varicella (chickenpox) vaccine  You may need this vaccine if you have not already been vaccinated. Zoster (shingles) vaccine  You may need this after age 63. Measles, mumps, and rubella (MMR) vaccine  You may need at least one dose of MMR if you were born in 1957 or later. You may also need a second dose. Pneumococcal conjugate (PCV13) vaccine  You may need this if you have certain conditions and were not previously vaccinated. Pneumococcal polysaccharide (PPSV23) vaccine  You may need one or two doses if you smoke cigarettes or if you have certain conditions. Meningococcal conjugate (MenACWY) vaccine  You may need this if you have certain conditions. Hepatitis A vaccine  You may need this if you have  certain conditions or if you travel or work in places where you may be exposed to hepatitis A. Hepatitis B vaccine  You may need this if you have certain conditions or if you travel or work in places where you may be exposed to hepatitis B. Haemophilus influenzae type b (Hib) vaccine  You may need this if you have certain risk factors. Human papillomavirus (HPV) vaccine  If recommended by your health care provider, you may need three doses over 6 months. You may receive vaccines as individual doses or as more than one vaccine together in one shot (combination vaccines). Talk with your health care provider about the risks and benefits of combination vaccines. What tests do I need? Blood tests  Lipid and cholesterol levels. These may be checked every 5 years, or more frequently if you are over 68 years old.  Hepatitis C test.  Hepatitis B test. Screening  Lung cancer screening. You may have this screening every year starting at age 78 if you have a 30-pack-year history of smoking and currently smoke or have quit within the past 15 years.  Prostate cancer screening. Recommendations will vary depending on your family history and other risks.  Colorectal cancer screening. All adults should have this screening starting at age 38 and continuing until age 22. Your health care provider may recommend screening at age 73 if you are at increased risk. You will have tests every 1-10 years, depending on your results and the type of screening test.  Diabetes screening. This is done by checking your blood sugar (glucose) after you have not eaten for a while (fasting). You may have this done every 1-3 years.  Sexually transmitted disease (STD) testing. Follow these instructions at home: Eating and drinking  Eat a diet that includes fresh fruits and vegetables, whole grains, lean protein, and low-fat dairy products.  Take vitamin and mineral supplements as recommended by your health care  provider.  Do not drink alcohol if your health care provider tells you not to drink.  If you drink alcohol: ? Limit how much you have to 0-2 drinks a day. ? Be aware of how much alcohol is in your drink. In the [place] one drink equals one 12 oz bottle of beer (355 mL), one 5 oz glass of wine (148 mL), or one 1 oz glass of hard liquor (44 mL). Lifestyle  Take daily care of your teeth and gums.  Stay active. Exercise for at least 30 minutes on 5 or more days each week.  Do not use any products that contain nicotine or tobacco, such as cigarettes, e-cigarettes, and chewing tobacco. If you need help quitting, ask your health care provider.  If  you are sexually active, practice safe sex. Use a condom or other form of protection to prevent STIs (sexually transmitted infections).  Talk with your health care provider about taking a low-dose aspirin every day starting at age 50. What's next?  Go to your health care provider once a year for a well check visit.  Ask your health care provider how often you should have your eyes and teeth checked.  Stay up to date on all vaccines. This information is not intended to replace advice given to you by your health care provider. Make sure you discuss any questions you have with your health care provider. Document Revised: 07/17/2018 Document Reviewed: 07/17/2018 [employer] Patient Education  2020 [employer]  

## 2020-01-19 LAB — HEPATITIS C ANTIBODY
Hepatitis C Ab: NONREACTIVE
SIGNAL TO CUT-OFF: 0 (ref <1.00)

## 2020-01-19 LAB — HIV ANTIBODY (ROUTINE TESTING W REFLEX): HIV 1&2 Ab, 4th Generation: NONREACTIVE

## 2020-07-18 ENCOUNTER — Ambulatory Visit: Payer: BC Managed Care – PPO | Admitting: Family Medicine

## 2020-07-25 ENCOUNTER — Ambulatory Visit: Payer: BC Managed Care – PPO | Admitting: Family Medicine

## 2020-08-15 ENCOUNTER — Ambulatory Visit: Payer: BC Managed Care – PPO | Admitting: Family Medicine

## 2020-09-19 ENCOUNTER — Ambulatory Visit: Payer: BC Managed Care – PPO | Admitting: Family Medicine

## 2020-10-24 ENCOUNTER — Ambulatory Visit: Payer: BC Managed Care – PPO | Admitting: Family Medicine

## 2020-11-14 ENCOUNTER — Encounter: Payer: Self-pay | Admitting: Family Medicine

## 2020-11-14 ENCOUNTER — Ambulatory Visit: Payer: BC Managed Care – PPO | Admitting: Family Medicine

## 2020-11-14 ENCOUNTER — Other Ambulatory Visit: Payer: Self-pay

## 2020-11-14 VITALS — BP 142/80 | HR 78 | Temp 96.9°F | Ht 72.0 in | Wt 199.4 lb

## 2020-11-14 DIAGNOSIS — E559 Vitamin D deficiency, unspecified: Secondary | ICD-10-CM | POA: Diagnosis not present

## 2020-11-14 DIAGNOSIS — Z23 Encounter for immunization: Secondary | ICD-10-CM

## 2020-11-14 DIAGNOSIS — I10 Essential (primary) hypertension: Secondary | ICD-10-CM | POA: Diagnosis not present

## 2020-11-14 LAB — BASIC METABOLIC PANEL
BUN: 14 mg/dL (ref 6–23)
CO2: 28 mEq/L (ref 19–32)
Calcium: 9.7 mg/dL (ref 8.4–10.5)
Chloride: 104 mEq/L (ref 96–112)
Creatinine, Ser: 0.91 mg/dL (ref 0.40–1.50)
GFR: 94.64 mL/min (ref >60.00)
Glucose, Bld: 90 mg/dL (ref 70–99)
Potassium: 4.6 mEq/L (ref 3.5–5.1)
Sodium: 139 mEq/L (ref 135–145)

## 2020-11-14 LAB — VITAMIN D 25 HYDROXY (VIT D DEFICIENCY, FRACTURES): VITD: 40.56 ng/mL (ref 30.00–100.00)

## 2020-11-14 MED ORDER — OLMESARTAN MEDOXOMIL 20 MG PO TABS: 20.0000 mg | ORAL_TABLET | Freq: Every day | ORAL | 2 refills | Status: DC

## 2020-11-14 NOTE — Patient Instructions (Signed)
Managing Your Hypertension Hypertension, also called high blood pressure, is when the force of the blood pressing against the walls of the arteries is too strong. Arteries are blood vessels that carry blood from your heart throughout your body. Hypertension forces the heart to work harder to pump blood and may cause the arteries to become narrow or stiff. Understanding blood pressure readings Your personal target blood pressure may vary depending on your medical conditions, your age, and other factors. A blood pressure reading includes a higher number over a lower number. Ideally, your blood pressure should be below 120/80. You should know that:  The first, or top, number is called the systolic pressure. It is a measure of the pressure in your arteries as your heart beats.  The second, or bottom number, is called the diastolic pressure. It is a measure of the pressure in your arteries as the heart relaxes. Blood pressure is classified into four stages. Based on your blood pressure reading, your health care provider may use the following stages to determine what type of treatment you need, if any. Systolic pressure and diastolic pressure are measured in a unit called mmHg. Normal  Systolic pressure: below 120.  Diastolic pressure: below 80. Elevated  Systolic pressure: 120-129.  Diastolic pressure: below 80. Hypertension stage 1  Systolic pressure: 130-139.  Diastolic pressure: 80-89. Hypertension stage 2  Systolic pressure: 140 or above.  Diastolic pressure: 90 or above. How can this condition affect me? Managing your hypertension is an important responsibility. Over time, hypertension can damage the arteries and decrease blood flow to important parts of the body, including the brain, heart, and kidneys. Having untreated or uncontrolled hypertension can lead to:  A heart attack.  A stroke.  A weakened blood vessel (aneurysm).  Heart failure.  Kidney damage.  Eye  damage.  Metabolic syndrome.  Memory and concentration problems.  Vascular dementia. What actions can I take to manage this condition? Hypertension can be managed by making lifestyle changes and possibly by taking medicines. Your health care provider will help you make a plan to bring your blood pressure within a normal range. Nutrition  Eat a diet that is high in fiber and potassium, and low in salt (sodium), added sugar, and fat. An example eating plan is called the Dietary Approaches to Stop Hypertension (DASH) diet. To eat this way: ? Eat plenty of fresh fruits and vegetables. Try to fill one-half of your plate at each meal with fruits and vegetables. ? Eat whole grains, such as whole-wheat pasta, brown rice, or whole-grain bread. Fill about one-fourth of your plate with whole grains. ? Eat low-fat dairy products. ? Avoid fatty cuts of meat, processed or cured meats, and poultry with skin. Fill about one-fourth of your plate with lean proteins such as fish, chicken without skin, beans, eggs, and tofu. ? Avoid pre-made and processed foods. These tend to be higher in sodium, added sugar, and fat.  Reduce your daily sodium intake. Most people with hypertension should eat less than 1,500 mg of sodium a day.   Lifestyle  Work with your health care provider to maintain a healthy body weight or to lose weight. Ask what an ideal weight is for you.  Get at least 30 minutes of exercise that causes your heart to beat faster (aerobic exercise) most days of the week. Activities may include walking, swimming, or biking.  Include exercise to strengthen your muscles (resistance exercise), such as weight lifting, as part of your weekly exercise routine. Try   to do these types of exercises for 30 minutes at least 3 days a week.  Do not use any products that contain nicotine or tobacco, such as cigarettes, e-cigarettes, and chewing tobacco. If you need help quitting, ask your health care  provider.  Control any long-term (chronic) conditions you have, such as high cholesterol or diabetes.  Identify your sources of stress and find ways to manage stress. This may include meditation, deep breathing, or making time for fun activities.   Alcohol use  Do not drink alcohol if: ? Your health care provider tells you not to drink. ? You are pregnant, may be pregnant, or are planning to become pregnant.  If you drink alcohol: ? Limit how much you use to:  0-1 drink a day for women.  0-2 drinks a day for men. ? Be aware of how much alcohol is in your drink. In the [place] one drink equals one 12 oz bottle of beer (355 mL), one 5 oz glass of wine (148 mL), or one 1 oz glass of hard liquor (44 mL). Medicines Your health care provider may prescribe medicine if lifestyle changes are not enough to get your blood pressure under control and if:  Your systolic blood pressure is 130 or higher.  Your diastolic blood pressure is 80 or higher. Take medicines only as told by your health care provider. Follow the directions carefully. Blood pressure medicines must be taken as told by your health care provider. The medicine does not work as well when you skip doses. Skipping doses also puts you at risk for problems. Monitoring Before you monitor your blood pressure:  Do not smoke, drink caffeinated beverages, or exercise within 30 minutes before taking a measurement.  Use the bathroom and empty your bladder (urinate).  Sit quietly for at least 5 minutes before taking measurements. Monitor your blood pressure at home as told by your health care provider. To do this:  Sit with your back straight and supported.  Place your feet flat on the floor. Do not cross your legs.  Support your arm on a flat surface, such as a table. Make sure your upper arm is at heart level.  Each time you measure, take two or three readings one minute apart and record the results. You may also need to have your  blood pressure checked regularly by your health care provider.   General information  Talk with your health care provider about your diet, exercise habits, and other lifestyle factors that may be contributing to hypertension.  Review all the medicines you take with your health care provider because there may be side effects or interactions.  Keep all visits as told by your health care provider. Your health care provider can help you create and adjust your plan for managing your high blood pressure. Where to find more information  National Heart, Lung, and Blood Institute: www.nhlbi.nih.gov  American Heart Association: www.heart.org Contact a health care provider if:  You think you are having a reaction to medicines you have taken.  You have repeated (recurrent) headaches.  You feel dizzy.  You have swelling in your ankles.  You have trouble with your vision. Get help right away if:  You develop a severe headache or confusion.  You have unusual weakness or numbness, or you feel faint.  You have severe pain in your chest or abdomen.  You vomit repeatedly.  You have trouble breathing. These symptoms may represent a serious problem that is an emergency. Do not wait   to see if the symptoms will go away. Get medical help right away. Call your local emergency services (911 in the [place] Do not drive yourself to the hospital. Summary  Hypertension is when the force of blood pumping through your arteries is too strong. If this condition is not controlled, it may put you at risk for serious complications.  Your personal target blood pressure may vary depending on your medical conditions, your age, and other factors. For most people, a normal blood pressure is less than 120/80.  Hypertension is managed by lifestyle changes, medicines, or both.  Lifestyle changes to help manage hypertension include losing weight, eating a healthy, low-sodium diet, exercising more, stopping smoking, and  limiting alcohol. This information is not intended to replace advice given to you by your health care provider. Make sure you discuss any questions you have with your health care provider. Document Revised: 08/28/2019 Document Reviewed: 06/23/2019 Elsevier Patient Education  2021 [employer]. Recombinant Zoster (Shingles) Vaccine: What You Need to Know 1. Why get vaccinated? Recombinant zoster (shingles) vaccine can prevent shingles. Shingles (also called herpes zoster, or just zoster) is a painful skin rash, usually with blisters. In addition to the rash, shingles can cause fever, headache, chills, or upset stomach. More rarely, shingles can lead to pneumonia, hearing problems, blindness, brain inflammation (encephalitis), or death. The most common complication of shingles is long-term nerve pain called postherpetic neuralgia (PHN). PHN occurs in the areas where the shingles rash was, even after the rash clears up. It can last for months or years after the rash goes away. The pain from PHN can be severe and debilitating. About 10 to 18% of people who get shingles will experience PHN. The risk of PHN increases with age. An older adult with shingles is more likely to develop PHN and have longer lasting and more severe pain than a younger person with shingles. Shingles is caused by the varicella zoster virus, the same virus that causes chickenpox. After you have chickenpox, the virus stays in your body and can cause shingles later in life. Shingles cannot be passed from one person to another, but the virus that causes shingles can spread and cause chickenpox in someone who had never had chickenpox or received chickenpox vaccine. 2. Recombinant shingles vaccine Recombinant shingles vaccine provides strong protection against shingles. By preventing shingles, recombinant shingles vaccine also protects against PHN. Recombinant shingles vaccine is the preferred vaccine for the prevention of shingles.  However, a different vaccine, live shingles vaccine, may be used in some circumstances. The recombinant shingles vaccine is recommended for adults 50 years and older without serious immune problems. It is given as a two-dose series. This vaccine is also recommended for people who have already gotten another type of shingles vaccine, the live shingles vaccine. There is no live virus in this vaccine. Shingles vaccine may be given at the same time as other vaccines. 3. Talk with your health care provider Tell your vaccine provider if the person getting the vaccine:  Has had an allergic reaction after a previous dose of recombinant shingles vaccine, or has any severe, life-threatening allergies.  Is pregnant or breastfeeding.  Is currently experiencing an episode of shingles. In some cases, your health care provider may decide to postpone shingles vaccination to a future visit. People with minor illnesses, such as a cold, may be vaccinated. People who are moderately or severely ill should usually wait until they recover before getting recombinant shingles vaccine. Your health care provider can give you  more information. 4. Risks of a vaccine reaction  A sore arm with mild or moderate pain is very common after recombinant shingles vaccine, affecting about 80% of vaccinated people. Redness and swelling can also happen at the site of the injection.  Tiredness, muscle pain, headache, shivering, fever, stomach pain, and nausea happen after vaccination in more than half of people who receive recombinant shingles vaccine. In clinical trials, about 1 out of 6 people who got recombinant zoster vaccine experienced side effects that prevented them from doing regular activities. Symptoms usually went away on their own in 2 to 3 days. You should still get the second dose of recombinant zoster vaccine even if you had one of these reactions after the first dose. People sometimes faint after medical procedures,  including vaccination. Tell your provider if you feel dizzy or have vision changes or ringing in the ears. As with any medicine, there is a very remote chance of a vaccine causing a severe allergic reaction, other serious injury, or death. 5. What if there is a serious problem? An allergic reaction could occur after the vaccinated person leaves the clinic. If you see signs of a severe allergic reaction (hives, swelling of the face and throat, difficulty breathing, a fast heartbeat, dizziness, or weakness), call 9-1-1 and get the person to the nearest hospital. For other signs that concern you, call your health care provider. Adverse reactions should be reported to the Vaccine Adverse Event Reporting System (VAERS). Your health care provider will usually file this report, or you can do it yourself. Visit the VAERS website at www.vaers.SamedayNews.es or call (838) 817-2921. VAERS is only for reporting reactions, and VAERS staff do not give medical advice. 6. How can I learn more?  Ask your health care provider.  Call your local or state health department.  Contact the Centers for Disease Control and Prevention (CDC): ? Call 905-481-1335 (1-800-CDC-INFO) or ? Visit CDC's website at http://hunter.com/ Vaccine Information Statement Recombinant Zoster Vaccine (06/04/2018) This information is not intended to replace advice given to you by your health care provider. Make sure you discuss any questions you have with your health care provider. Document Revised: 03/25/2020 Document Reviewed: 03/25/2020 Elsevier Patient Education  [employer].

## 2020-11-14 NOTE — Progress Notes (Signed)
Established Patient Office Visit  Subjective:  Patient ID: Andrew Houston, male    DOB: May 09, 1966  Age: 55 y.o. MRN: [id number]  CC:  Chief Complaint  Patient presents with  . Follow-up    6 month follow up, no concerns.     HPI Andrew Houston presents for follow-up of hypertension.  Has been doing well with the olmesartan.  Blood pressure runs in the 1 20-1 40 range over 70-80.  Businesses down a bit.  Inflation has affected higher prices by 25%.  Past Medical History:  Diagnosis Date  . Hypertension     Past Surgical History:  Procedure Laterality Date  . TONSILLECTOMY    . Tubes in ears      Family History  Problem Relation Age of Onset  . Colon cancer Mother   . Hypertension Mother   . Colon cancer Maternal Uncle   . Esophageal cancer Neg Hx   . Rectal cancer Neg Hx   . Stomach cancer Neg Hx     Social History   Socioeconomic History  . Marital status: Married    Spouse name: Not on file  . Number of children: Not on file  . Years of education: Not on file  . Highest education level: Not on file  Occupational History  . Not on file  Tobacco Use  . Smoking status: Never Smoker  . Smokeless tobacco: Current User    Types: Chew  Vaping Use  . Vaping Use: Never used  Substance and Sexual Activity  . Alcohol use: Yes    Comment: weekly beer  . Drug use: Never  . Sexual activity: Yes  Other Topics Concern  . Not on file  Social History Narrative  . Not on file   Social Determinants of Health   Financial Resource Strain: Not on file  Food Insecurity: Not on file  Transportation Needs: Not on file  Physical Activity: Not on file  Stress: Not on file  Social Connections: Not on file  Intimate Partner Violence: Not on file    Outpatient Medications Prior to Visit  Medication Sig Dispense Refill  . Cholecalciferol (VITAMIN D) 50 MCG (2000 UT) tablet Take 2,000 Units by mouth daily.    . Multiple Vitamin (MULTIVITAMIN) tablet Take 1 tablet by mouth  daily.    Marland Kitchen olmesartan (BENICAR) 20 MG tablet Take 1 tablet (20 mg total) by mouth daily. 90 tablet 2   No facility-administered medications prior to visit.    No Known Allergies  ROS Review of Systems  Constitutional: Negative.   HENT: Negative.   Respiratory: Negative.   Cardiovascular: Negative.   Gastrointestinal: Negative.   Neurological: Negative for headaches.  Psychiatric/Behavioral: Negative.       Objective:    Physical Exam Vitals and nursing note reviewed.  Constitutional:      General: He is not in acute distress.    Appearance: Normal appearance. He is not ill-appearing, toxic-appearing or diaphoretic.  HENT:     Head: Normocephalic and atraumatic.     Right Ear: External ear normal.     Left Ear: External ear normal.     Mouth/Throat:     Mouth: Mucous membranes are moist.     Pharynx: Oropharynx is clear. No oropharyngeal exudate or posterior oropharyngeal erythema.  Eyes:     General: No scleral icterus.       Right eye: No discharge.        Left eye: No discharge.     Extraocular Movements:  Extraocular movements intact.     Conjunctiva/sclera: Conjunctivae normal.     Pupils: Pupils are equal, round, and reactive to light.  Cardiovascular:     Rate and Rhythm: Normal rate and regular rhythm.  Pulmonary:     Effort: Pulmonary effort is normal.     Breath sounds: Normal breath sounds.  Musculoskeletal:     Cervical back: No rigidity or tenderness.  Lymphadenopathy:     Cervical: No cervical adenopathy.  Neurological:     Mental Status: He is alert and oriented to person, place, and time.  Psychiatric:        Mood and Affect: Mood normal.        Behavior: Behavior normal.     BP (!) 142/80   Pulse 78   Temp (!) 96.9 F (36.1 C) (Temporal)   Ht 6' (1.829 m)   Wt 199 lb 6.4 oz (90.4 kg)   SpO2 98%   BMI 27.04 kg/m  Wt Readings from Last 3 Encounters:  11/14/20 199 lb 6.4 oz (90.4 kg)  01/18/20 197 lb 12.8 oz (89.7 kg)  10/12/19 198 lb  9.6 oz (90.1 kg)   B.P. 132/82 by my exam.   There are no preventive care reminders to display for this patient.  There are no preventive care reminders to display for this patient.  Lab Results  Component Value Date   TSH 1.16 02/16/2019   Lab Results  Component Value Date   WBC 4.3 01/18/2020   HGB 14.2 01/18/2020   HCT 41.2 01/18/2020   MCV 93.7 01/18/2020   PLT 158.0 01/18/2020   Lab Results  Component Value Date   NA 137 01/18/2020   K 4.3 01/18/2020   CO2 26 01/18/2020   GLUCOSE 110 (H) 01/18/2020   BUN 16 01/18/2020   CREATININE 0.97 01/18/2020   BILITOT 0.4 01/18/2020   ALKPHOS 60 01/18/2020   AST 22 01/18/2020   ALT 21 01/18/2020   PROT 6.8 01/18/2020   ALBUMIN 4.3 01/18/2020   CALCIUM 9.4 01/18/2020   GFR 80.34 01/18/2020   Lab Results  Component Value Date   CHOL 183 01/18/2020   Lab Results  Component Value Date   HDL 48.00 01/18/2020   Lab Results  Component Value Date   LDLCALC 120 (H) 01/18/2020   Lab Results  Component Value Date   TRIG 73.0 01/18/2020   Lab Results  Component Value Date   CHOLHDL 4 01/18/2020   No results found for: HGBA1C    Assessment & Plan:   Problem List Items Addressed This Visit      Cardiovascular and Mediastinum   Essential hypertension   Relevant Medications   olmesartan (BENICAR) 20 MG tablet   Other Relevant Orders   Basic metabolic panel     Other   Vitamin D deficiency - Primary   Relevant Orders   VITAMIN D 25 Hydroxy (Vit-D Deficiency, Fractures)      Meds ordered this encounter  Medications  . olmesartan (BENICAR) 20 MG tablet    Sig: Take 1 tablet (20 mg total) by mouth daily.    Dispense:  90 tablet    Refill:  2    Follow-up: Return in about 6 months (around 05/16/2021), or check and record bps.Libby Maw, MD

## 2021-05-22 ENCOUNTER — Other Ambulatory Visit: Payer: Self-pay

## 2021-05-22 ENCOUNTER — Encounter: Payer: Self-pay | Admitting: Family Medicine

## 2021-05-22 ENCOUNTER — Ambulatory Visit: Payer: BC Managed Care – PPO | Admitting: Family Medicine

## 2021-05-22 VITALS — BP 134/76 | HR 78 | Temp 96.7°F | Ht 72.0 in | Wt 199.4 lb

## 2021-05-22 DIAGNOSIS — Z125 Encounter for screening for malignant neoplasm of prostate: Secondary | ICD-10-CM

## 2021-05-22 DIAGNOSIS — E559 Vitamin D deficiency, unspecified: Secondary | ICD-10-CM

## 2021-05-22 DIAGNOSIS — Z23 Encounter for immunization: Secondary | ICD-10-CM | POA: Insufficient documentation

## 2021-05-22 DIAGNOSIS — Z0000 Encounter for general adult medical examination without abnormal findings: Secondary | ICD-10-CM | POA: Diagnosis not present

## 2021-05-22 DIAGNOSIS — I10 Essential (primary) hypertension: Secondary | ICD-10-CM

## 2021-05-22 LAB — COMPREHENSIVE METABOLIC PANEL
ALT: 37 U/L (ref 0–53)
AST: 34 U/L (ref 0–37)
Albumin: 4.4 g/dL (ref 3.5–5.2)
Alkaline Phosphatase: 60 U/L (ref 39–117)
BUN: 18 mg/dL (ref 6–23)
CO2: 27 mEq/L (ref 19–32)
Calcium: 9.4 mg/dL (ref 8.4–10.5)
Chloride: 104 mEq/L (ref 96–112)
Creatinine, Ser: 0.99 mg/dL (ref 0.40–1.50)
GFR: 85.23 mL/min (ref >60.00)
Glucose, Bld: 99 mg/dL (ref 70–99)
Potassium: 4.2 mEq/L (ref 3.5–5.1)
Sodium: 138 mEq/L (ref 135–145)
Total Bilirubin: 0.4 mg/dL (ref 0.2–1.2)
Total Protein: 6.6 g/dL (ref 6.0–8.3)

## 2021-05-22 LAB — CBC
HCT: 41.2 % (ref 39.0–52.0)
Hemoglobin: 13.9 g/dL (ref 13.0–17.0)
MCHC: 33.8 g/dL (ref 30.0–36.0)
MCV: 93.8 fl (ref 78.0–100.0)
Platelets: 185 10*3/uL (ref 150.0–400.0)
RBC: 4.39 Mil/uL (ref 4.22–5.81)
RDW: 12.8 % (ref 11.5–15.5)
WBC: 4.1 10*3/uL (ref 4.0–10.5)

## 2021-05-22 LAB — URINALYSIS, ROUTINE W REFLEX MICROSCOPIC
Bilirubin Urine: NEGATIVE
Hgb urine dipstick: NEGATIVE
Ketones, ur: NEGATIVE
Leukocytes,Ua: NEGATIVE
Nitrite: NEGATIVE
RBC / HPF: NONE SEEN (ref 0–?)
Specific Gravity, Urine: <=1.005 — AB (ref 1.000–1.030)
Total Protein, Urine: NEGATIVE
Urine Glucose: NEGATIVE
Urobilinogen, UA: 0.2 (ref 0.0–1.0)
WBC, UA: NONE SEEN (ref 0–?)
pH: 6 (ref 5.0–8.0)

## 2021-05-22 LAB — LIPID PANEL
Cholesterol: 167 mg/dL (ref 0–200)
HDL: 53.2 mg/dL (ref >39.00)
LDL Cholesterol: 102 mg/dL — ABNORMAL HIGH (ref 0–99)
NonHDL: 113.79
Total CHOL/HDL Ratio: 3
Triglycerides: 60 mg/dL (ref 0.0–149.0)
VLDL: 12 mg/dL (ref 0.0–40.0)

## 2021-05-22 LAB — HEMOGLOBIN A1C: Hgb A1c MFr Bld: 5.6 % (ref 4.6–6.5)

## 2021-05-22 LAB — PSA: PSA: 0.48 ng/mL (ref 0.10–4.00)

## 2021-05-22 LAB — VITAMIN D 25 HYDROXY (VIT D DEFICIENCY, FRACTURES): VITD: 44.64 ng/mL (ref 30.00–100.00)

## 2021-05-22 NOTE — Progress Notes (Signed)
Established Patient Office Visit  Subjective:  Patient ID: Andrew Houston, male    DOB: 03-08-1966  Age: 55 y.o. MRN: [id number]  CC:  Chief Complaint  Patient presents with   Follow-up    6 month follow up no concerns. Patient fasting.     HPI Andrew Houston presents for follow-up of hypertension and for his yearly health check.  He has been doing well.  Blood pressures controlled with olmesartan 20 mg daily.  Blood pressures at home this morning compared to 1 week.  Continues in the tire business.  Just finished of major landscaping project at his home.  Past Medical History:  Diagnosis Date   Hypertension     Past Surgical History:  Procedure Laterality Date   TONSILLECTOMY     Tubes in ears      Family History  Problem Relation Age of Onset   Colon cancer Mother    Hypertension Mother    Colon cancer Maternal Uncle    Esophageal cancer Neg Hx    Rectal cancer Neg Hx    Stomach cancer Neg Hx     Social History   Socioeconomic History   Marital status: Married    Spouse name: Not on file   Number of children: Not on file   Years of education: Not on file   Highest education level: Not on file  Occupational History   Not on file  Tobacco Use   Smoking status: Never   Smokeless tobacco: Current    Types: Chew  Vaping Use   Vaping Use: Never used  Substance and Sexual Activity   Alcohol use: Yes    Comment: weekly beer   Drug use: Never   Sexual activity: Yes  Other Topics Concern   Not on file  Social History Narrative   Not on file   Social Determinants of Health   Financial Resource Strain: Not on file  Food Insecurity: Not on file  Transportation Needs: Not on file  Physical Activity: Not on file  Stress: Not on file  Social Connections: Not on file  Intimate Partner Violence: Not on file    Outpatient Medications Prior to Visit  Medication Sig Dispense Refill   Cholecalciferol (VITAMIN D) 50 MCG (2000 UT) tablet Take 2,000 Units by mouth  daily.     Multiple Vitamin (MULTIVITAMIN) tablet Take 1 tablet by mouth daily.     olmesartan (BENICAR) 20 MG tablet Take 1 tablet (20 mg total) by mouth daily. 90 tablet 2   No facility-administered medications prior to visit.    No Known Allergies  ROS Review of Systems  Constitutional: Negative.   HENT: Negative.    Eyes:  Negative for photophobia and visual disturbance.  Respiratory: Negative.    Cardiovascular: Negative.   Gastrointestinal: Negative.   Endocrine: Negative for polyphagia and polyuria.  Genitourinary: Negative.   Musculoskeletal: Negative.   Neurological:  Negative for speech difficulty and weakness.  Psychiatric/Behavioral: Negative.    Depression screen [facility] 2/9 05/22/2021 11/14/2020 01/18/2020  Decreased Interest 0 0 0  Down, Depressed, Hopeless 0 0 0  PHQ - 2 Score 0 0 0  Altered sleeping - - 0  Tired, decreased energy - - 0  Change in appetite - - 0  Feeling bad or failure about yourself  - - 0  Trouble concentrating - - 0  Moving slowly or fidgety/restless - - 0  Suicidal thoughts - - 0  PHQ-9 Score - - 0  Difficult doing work/chores - -  Not difficult at all       Objective:    Physical Exam Vitals and nursing note reviewed.  Constitutional:      General: He is not in acute distress.    Appearance: Normal appearance. He is normal weight. He is not ill-appearing, toxic-appearing or diaphoretic.  HENT:     Head: Normocephalic and atraumatic.     Right Ear: Tympanic membrane, ear canal and external ear normal.     Left Ear: Tympanic membrane, ear canal and external ear normal.     Mouth/Throat:     Mouth: Mucous membranes are moist.     Pharynx: Oropharynx is clear. No oropharyngeal exudate or posterior oropharyngeal erythema.  Eyes:     General: No scleral icterus.       Right eye: No discharge.        Left eye: No discharge.     Extraocular Movements: Extraocular movements intact.     Conjunctiva/sclera: Conjunctivae normal.      Pupils: Pupils are equal, round, and reactive to light.  Neck:     Vascular: No carotid bruit.  Cardiovascular:     Rate and Rhythm: Normal rate and regular rhythm.  Pulmonary:     Effort: Pulmonary effort is normal.     Breath sounds: Normal breath sounds.  Abdominal:     General: Abdomen is flat. Bowel sounds are normal. There is no distension.     Palpations: Abdomen is soft. There is no mass.     Tenderness: There is no abdominal tenderness. There is no guarding or rebound.     Hernia: No hernia is present.  Musculoskeletal:     Cervical back: No rigidity or tenderness.  Lymphadenopathy:     Cervical: No cervical adenopathy.  Skin:    General: Skin is warm and dry.  Neurological:     Mental Status: He is alert and oriented to person, place, and time.  Psychiatric:        Mood and Affect: Mood normal.        Behavior: Behavior normal.    BP 134/76 (BP Location: Right Arm, Patient Position: Sitting, Cuff Size: Normal)   Pulse 78   Temp (!) 96.7 F (35.9 C) (Temporal)   Ht 6' (1.829 m)   Wt 199 lb 6.4 oz (90.4 kg)   SpO2 95%   BMI 27.04 kg/m  Wt Readings from Last 3 Encounters:  05/22/21 199 lb 6.4 oz (90.4 kg)  11/14/20 199 lb 6.4 oz (90.4 kg)  01/18/20 197 lb 12.8 oz (89.7 kg)     Health Maintenance Due  Topic Date Due   INFLUENZA VACCINE  03/06/2021    There are no preventive care reminders to display for this patient.  Lab Results  Component Value Date   TSH 1.16 02/16/2019   Lab Results  Component Value Date   WBC 4.3 01/18/2020   HGB 14.2 01/18/2020   HCT 41.2 01/18/2020   MCV 93.7 01/18/2020   PLT 158.0 01/18/2020   Lab Results  Component Value Date   NA 139 11/14/2020   K 4.6 11/14/2020   CO2 28 11/14/2020   GLUCOSE 90 11/14/2020   BUN 14 11/14/2020   CREATININE 0.91 11/14/2020   BILITOT 0.4 01/18/2020   ALKPHOS 60 01/18/2020   AST 22 01/18/2020   ALT 21 01/18/2020   PROT 6.8 01/18/2020   ALBUMIN 4.3 01/18/2020   CALCIUM 9.7  11/14/2020   GFR 94.64 11/14/2020   Lab Results  Component Value Date  CHOL 183 01/18/2020   Lab Results  Component Value Date   HDL 48.00 01/18/2020   Lab Results  Component Value Date   LDLCALC 120 (H) 01/18/2020   Lab Results  Component Value Date   TRIG 73.0 01/18/2020   Lab Results  Component Value Date   CHOLHDL 4 01/18/2020   No results found for: HGBA1C    Assessment & Plan:   Problem List Items Addressed This Visit       Cardiovascular and Mediastinum   Essential hypertension   Relevant Orders   CBC   Comprehensive metabolic panel     Other   Healthcare maintenance   Relevant Orders   Lipid panel   Hemoglobin A1c   Urinalysis, Routine w reflex microscopic   PSA   Vitamin D deficiency   Relevant Orders   VITAMIN D 25 Hydroxy (Vit-D Deficiency, Fractures)   Need for shingles vaccine - Primary   Relevant Orders   Varicella-zoster vaccine IM (Shingrix) (Completed)    No orders of the defined types were placed in this encounter.   Follow-up: Return in about 1 year (around 05/22/2022).  Continue olmesartan.  Information was given on health maintenance and disease prevention.  Up-to-date on health maintenance.  Pending normal blood work follow-up in 1 year.  Libby Maw, MD

## 2021-10-13 ENCOUNTER — Other Ambulatory Visit: Payer: Self-pay | Admitting: Family Medicine

## 2021-10-13 DIAGNOSIS — I10 Essential (primary) hypertension: Secondary | ICD-10-CM

## 2022-07-22 ENCOUNTER — Other Ambulatory Visit: Payer: Self-pay | Admitting: Family Medicine

## 2022-07-22 DIAGNOSIS — I10 Essential (primary) hypertension: Secondary | ICD-10-CM

## 2022-10-23 ENCOUNTER — Other Ambulatory Visit: Payer: Self-pay | Admitting: Family Medicine

## 2022-10-23 DIAGNOSIS — I10 Essential (primary) hypertension: Secondary | ICD-10-CM

## 2022-12-29 ENCOUNTER — Other Ambulatory Visit: Payer: Self-pay | Admitting: Family Medicine

## 2022-12-29 DIAGNOSIS — I10 Essential (primary) hypertension: Secondary | ICD-10-CM

## 2023-01-04 ENCOUNTER — Encounter: Payer: Self-pay | Admitting: Family Medicine

## 2023-01-04 ENCOUNTER — Ambulatory Visit: Payer: BC Managed Care – PPO | Admitting: Family Medicine

## 2023-01-04 VITALS — BP 140/86 | HR 84 | Temp 98.1°F | Ht 72.0 in | Wt 210.6 lb

## 2023-01-04 DIAGNOSIS — H5210 Myopia, unspecified eye: Secondary | ICD-10-CM | POA: Insufficient documentation

## 2023-01-04 DIAGNOSIS — I10 Essential (primary) hypertension: Secondary | ICD-10-CM

## 2023-01-04 MED ORDER — OLMESARTAN MEDOXOMIL 20 MG PO TABS: ORAL_TABLET | ORAL | 1 refills | Status: DC

## 2023-01-04 NOTE — Progress Notes (Signed)
Established Patient Office Visit   Subjective:  Patient ID: Andrew Houston, male    DOB: Jun 10, 1966  Age: 57 y.o. MRN: [id number]  Chief Complaint  Patient presents with   Medical Management of Chronic Issues    Follow up/refill on meds, no concerns.     HPI Encounter Diagnoses  Name Primary?   Essential hypertension Yes   Myopia, unspecified laterality    For follow-up of hypertension status post lost to follow-up.  Has been out of his medicine for a day.  BP at home has been running in the 120s over 80s.  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  He has had a lot on his plate.  Sounds as though mom died of renal/liver failure.  She had a history of MDD and glaucoma.   Review of Systems  Constitutional: Negative.   HENT: Negative.    Eyes:  Negative for blurred vision, discharge and redness.  Respiratory: Negative.    Cardiovascular: Negative.   Gastrointestinal:  Negative for abdominal pain.  Genitourinary: Negative.   Musculoskeletal: Negative.  Negative for myalgias.  Skin:  Negative for rash.  Neurological:  Negative for tingling, loss of consciousness and weakness.  Endo/Heme/Allergies:  Negative for polydipsia.     Current Outpatient Medications:    Cholecalciferol (VITAMIN D) 50 MCG (2000 UT) tablet, Take 2,000 Units by mouth daily., Disp: , Rfl:    Multiple Vitamin (MULTIVITAMIN) tablet, Take 1 tablet by mouth daily., Disp: , Rfl:    olmesartan (BENICAR) 20 MG tablet, TAKE 1 TABLET(20 MG) BY MOUTH DAILY, Disp: 90 tablet, Rfl: 1   Objective:     BP (!) 140/86 (BP Location: Right Arm, Patient Position: Sitting, Cuff Size: Normal)   Pulse 84   Temp 98.1 F (36.7 C) (Temporal)   Ht 6' (1.829 m)   Wt 210 lb 9.6 oz (95.5 kg)   SpO2 96%   BMI 28.56 kg/m  BP Readings from Last 3 Encounters:  01/04/23 (!) 140/86  05/22/21 134/76  11/14/20 (!) 142/80   Wt Readings from Last 3 Encounters:  01/04/23 210 lb 9.6 oz (95.5 kg)   05/22/21 199 lb 6.4 oz (90.4 kg)  11/14/20 199 lb 6.4 oz (90.4 kg)      Physical Exam Constitutional:      General: He is not in acute distress.    Appearance: Normal appearance. He is not ill-appearing, toxic-appearing or diaphoretic.  HENT:     Head: Normocephalic and atraumatic.     Right Ear: External ear normal.     Left Ear: External ear normal.  Eyes:     General: No scleral icterus.       Right eye: No discharge.        Left eye: No discharge.     Extraocular Movements: Extraocular movements intact.     Conjunctiva/sclera: Conjunctivae normal.  Cardiovascular:     Rate and Rhythm: Normal rate and regular rhythm.  Pulmonary:     Effort: Pulmonary effort is normal. No respiratory distress.     Breath sounds: Normal breath sounds.  Skin:    General: Skin is warm and dry.  Neurological:     Mental Status: He is alert and oriented to person, place, and time.  Psychiatric:        Mood and Affect: Mood normal.        Behavior: Behavior normal.      No results found for any visits on 01/04/23.  The 10-year ASCVD risk score (Arnett DK, et al., 2019) is: 7.9%    Assessment & Plan:   Essential hypertension -     Olmesartan Medoxomil; TAKE 1 TABLET(20 MG) BY MOUTH DAILY  Dispense: 90 tablet; Refill: 1  Myopia, unspecified laterality -     Ambulatory referral to Ophthalmology    Return Please return for physical in the next 2 to 3 months.Mliss Sax, MD

## 2023-03-06 ENCOUNTER — Ambulatory Visit (INDEPENDENT_AMBULATORY_CARE_PROVIDER_SITE_OTHER): Payer: BC Managed Care – PPO | Admitting: Family Medicine

## 2023-03-06 ENCOUNTER — Encounter: Payer: Self-pay | Admitting: Family Medicine

## 2023-03-06 VITALS — BP 126/78 | HR 87 | Temp 97.7°F | Ht 72.0 in | Wt 212.2 lb

## 2023-03-06 DIAGNOSIS — Z0000 Encounter for general adult medical examination without abnormal findings: Secondary | ICD-10-CM | POA: Diagnosis not present

## 2023-03-06 DIAGNOSIS — Z125 Encounter for screening for malignant neoplasm of prostate: Secondary | ICD-10-CM | POA: Diagnosis not present

## 2023-03-06 DIAGNOSIS — I10 Essential (primary) hypertension: Secondary | ICD-10-CM | POA: Diagnosis not present

## 2023-03-06 DIAGNOSIS — R5383 Other fatigue: Secondary | ICD-10-CM | POA: Diagnosis not present

## 2023-03-06 DIAGNOSIS — Z13220 Encounter for screening for lipoid disorders: Secondary | ICD-10-CM | POA: Diagnosis not present

## 2023-03-06 LAB — CBC WITH DIFFERENTIAL/PLATELET
Basophils Absolute: 0 10*3/uL (ref 0.0–0.1)
Basophils Relative: 0.4 % (ref 0.0–3.0)
Eosinophils Absolute: 0.2 10*3/uL (ref 0.0–0.7)
Eosinophils Relative: 2.8 % (ref 0.0–5.0)
HCT: 43.7 % (ref 39.0–52.0)
Hemoglobin: 14.7 g/dL (ref 13.0–17.0)
Lymphocytes Relative: 26.4 % (ref 12.0–46.0)
Lymphs Abs: 1.5 10*3/uL (ref 0.7–4.0)
MCHC: 33.6 g/dL (ref 30.0–36.0)
MCV: 93.8 fl (ref 78.0–100.0)
Monocytes Absolute: 0.5 10*3/uL (ref 0.1–1.0)
Monocytes Relative: 8 % (ref 3.0–12.0)
Neutro Abs: 3.6 10*3/uL (ref 1.4–7.7)
Neutrophils Relative %: 62.4 % (ref 43.0–77.0)
Platelets: 186 10*3/uL (ref 150.0–400.0)
RBC: 4.66 Mil/uL (ref 4.22–5.81)
RDW: 13 % (ref 11.5–15.5)
WBC: 5.7 10*3/uL (ref 4.0–10.5)

## 2023-03-06 LAB — LIPID PANEL
Cholesterol: 214 mg/dL — ABNORMAL HIGH (ref 0–200)
HDL: 38.4 mg/dL — ABNORMAL LOW (ref >39.00)
LDL Cholesterol: 137 mg/dL — ABNORMAL HIGH (ref 0–99)
NonHDL: 175.37
Total CHOL/HDL Ratio: 6
Triglycerides: 191 mg/dL — ABNORMAL HIGH (ref 0.0–149.0)
VLDL: 38.2 mg/dL (ref 0.0–40.0)

## 2023-03-06 LAB — TSH: TSH: 0.8 u[IU]/mL (ref 0.35–5.50)

## 2023-03-06 LAB — COMPREHENSIVE METABOLIC PANEL
ALT: 37 U/L (ref 0–53)
AST: 27 U/L (ref 0–37)
Albumin: 4.5 g/dL (ref 3.5–5.2)
Alkaline Phosphatase: 60 U/L (ref 39–117)
BUN: 16 mg/dL (ref 6–23)
CO2: 25 mEq/L (ref 19–32)
Calcium: 9.8 mg/dL (ref 8.4–10.5)
Chloride: 104 mEq/L (ref 96–112)
Creatinine, Ser: 1.01 mg/dL (ref 0.40–1.50)
GFR: 82.17 mL/min (ref >60.00)
Glucose, Bld: 100 mg/dL — ABNORMAL HIGH (ref 70–99)
Potassium: 4.1 mEq/L (ref 3.5–5.1)
Sodium: 138 mEq/L (ref 135–145)
Total Bilirubin: 0.5 mg/dL (ref 0.2–1.2)
Total Protein: 7.1 g/dL (ref 6.0–8.3)

## 2023-03-06 LAB — PSA: PSA: 0.42 ng/mL (ref 0.10–4.00)

## 2023-03-06 NOTE — Progress Notes (Unsigned)
Established Patient Office Visit   Subjective:  Patient ID: Andrew Houston, male    DOB: 03-12-1966  Age: 57 y.o. MRN: [id number]  Chief Complaint  Patient presents with   Annual Exam    CPE. Pt is fasting.     HPI Encounter Diagnoses  Name Primary?   Essential hypertension Yes   Healthcare maintenance    Screening for prostate cancer    Other fatigue    Screening for hyperlipidemia    For health check and follow-up of above.  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  Anniversary of her death he has Aug 09, 2024or tomorrow.  He is doing well.  He is staying active and busy dealing with family affairs.  He has regular dental care and social engagement.  Blood pressure is well-controlled with olmesartan. {History (Optional):23778}  Review of Systems  Constitutional: Negative.   HENT: Negative.    Eyes:  Negative for blurred vision, discharge and redness.  Respiratory: Negative.    Cardiovascular: Negative.   Gastrointestinal:  Negative for abdominal pain.  Genitourinary: Negative.   Musculoskeletal: Negative.  Negative for myalgias.  Skin:  Negative for rash.  Neurological:  Negative for tingling, loss of consciousness and weakness.  Endo/Heme/Allergies:  Negative for polydipsia.      03/06/2023   10:48 AM 01/04/2023    1:53 PM 05/22/2021   10:32 AM  Depression screen PHQ 2/9  Decreased Interest 0 0 0  Down, Depressed, Hopeless 0 0 0  PHQ - 2 Score 0 0 0  Altered sleeping 0    Tired, decreased energy 0    Change in appetite 0    Feeling bad or failure about yourself  0    Trouble concentrating 0    Moving slowly or fidgety/restless 0    Suicidal thoughts 0    PHQ-9 Score 0    Difficult doing work/chores Not difficult at all         Current Outpatient Medications:    Cholecalciferol (VITAMIN D) 50 MCG (2000 UT) tablet, Take 2,000 Units by mouth daily., Disp: , Rfl:    Multiple Vitamin (MULTIVITAMIN) tablet, Take 1 tablet by  mouth daily., Disp: , Rfl:    olmesartan (BENICAR) 20 MG tablet, TAKE 1 TABLET(20 MG) BY MOUTH DAILY, Disp: 90 tablet, Rfl: 1   Objective:     BP 126/78 (BP Location: Right Arm, Patient Position: Sitting, Cuff Size: Large)   Pulse 87   Temp 97.7 F (36.5 C)   Ht 6' (1.829 m)   Wt 212 lb 3.2 oz (96.3 kg)   SpO2 94%   BMI 28.78 kg/m  {Vitals History (Optional):23777}  Physical Exam Constitutional:      General: He is not in acute distress.    Appearance: Normal appearance. He is not ill-appearing, toxic-appearing or diaphoretic.  HENT:     Head: Normocephalic and atraumatic.     Right Ear: Tympanic membrane, ear canal and external ear normal.     Left Ear: Tympanic membrane, ear canal and external ear normal.     Mouth/Throat:     Mouth: Mucous membranes are moist.     Pharynx: Oropharynx is clear. No oropharyngeal exudate or posterior oropharyngeal erythema.  Eyes:     General: No scleral icterus.       Right eye: No discharge.        Left eye: No discharge.     Extraocular Movements: Extraocular movements intact.  Conjunctiva/sclera: Conjunctivae normal.     Pupils: Pupils are equal, round, and reactive to light.  Cardiovascular:     Rate and Rhythm: Normal rate and regular rhythm.  Pulmonary:     Effort: Pulmonary effort is normal. No respiratory distress.     Breath sounds: Normal breath sounds. No wheezing, rhonchi or rales.  Abdominal:     General: Bowel sounds are normal.     Tenderness: There is no abdominal tenderness. There is no guarding or rebound.  Musculoskeletal:     Cervical back: No rigidity or tenderness.  Lymphadenopathy:     Cervical: No cervical adenopathy.  Skin:    General: Skin is warm and dry.  Neurological:     Mental Status: He is alert and oriented to person, place, and time.  Psychiatric:        Mood and Affect: Mood normal.        Behavior: Behavior normal.      No results found for any visits on 03/06/23.  {Labs  (Optional):23779}  The 10-year ASCVD risk score (Arnett DK, et al., 2019) is: 6.6%    Assessment & Plan:   Essential hypertension -     CBC with Differential/Platelet -     Comprehensive metabolic panel  Healthcare maintenance -     Urinalysis, Routine w reflex microscopic; Future  Screening for prostate cancer -     PSA  Other fatigue -     TSH  Screening for hyperlipidemia -     Lipid panel    Return in about 1 year (around 03/05/2024), or if symptoms worsen or fail to improve.  Will keep active and engaged.  Follow-up colonoscopy is pending.  Continue olmesartan for hypertension.  Information given on health maintenance and disease prevention.  Mliss Sax, MD

## 2023-04-25 ENCOUNTER — Ambulatory Visit: Payer: BC Managed Care – PPO

## 2023-04-25 DIAGNOSIS — Z23 Encounter for immunization: Secondary | ICD-10-CM | POA: Diagnosis not present

## 2023-05-29 ENCOUNTER — Encounter: Payer: Self-pay | Admitting: Gastroenterology

## 2023-06-28 ENCOUNTER — Other Ambulatory Visit: Payer: Self-pay | Admitting: Family Medicine

## 2023-06-28 DIAGNOSIS — I10 Essential (primary) hypertension: Secondary | ICD-10-CM

## 2023-07-03 ENCOUNTER — Encounter: Payer: Self-pay | Admitting: Gastroenterology

## 2023-07-03 ENCOUNTER — Ambulatory Visit: Payer: BC Managed Care – PPO

## 2023-07-03 VITALS — Ht 72.0 in | Wt 205.0 lb

## 2023-07-03 DIAGNOSIS — Z860100 Personal history of colon polyps, unspecified: Secondary | ICD-10-CM

## 2023-07-03 MED ORDER — NA SULFATE-K SULFATE-MG SULF 17.5-3.13-1.6 GM/177ML PO SOLN: 1.0000 | Freq: Once | ORAL | 0 refills | Status: AC

## 2023-07-03 NOTE — Progress Notes (Signed)

## 2023-07-17 ENCOUNTER — Ambulatory Visit: Payer: BC Managed Care – PPO | Admitting: Gastroenterology

## 2023-07-17 ENCOUNTER — Encounter: Payer: Self-pay | Admitting: Gastroenterology

## 2023-07-17 VITALS — BP 114/70 | HR 86 | Temp 97.7°F | Resp 23 | Ht 72.0 in | Wt 205.0 lb

## 2023-07-17 DIAGNOSIS — K640 First degree hemorrhoids: Secondary | ICD-10-CM

## 2023-07-17 DIAGNOSIS — K6289 Other specified diseases of anus and rectum: Secondary | ICD-10-CM | POA: Diagnosis not present

## 2023-07-17 DIAGNOSIS — K5730 Diverticulosis of large intestine without perforation or abscess without bleeding: Secondary | ICD-10-CM | POA: Diagnosis not present

## 2023-07-17 DIAGNOSIS — Z1211 Encounter for screening for malignant neoplasm of colon: Secondary | ICD-10-CM

## 2023-07-17 DIAGNOSIS — D120 Benign neoplasm of cecum: Secondary | ICD-10-CM | POA: Diagnosis not present

## 2023-07-17 DIAGNOSIS — Z800 Family history of malignant neoplasm of digestive organs: Secondary | ICD-10-CM

## 2023-07-17 DIAGNOSIS — Z860101 Personal history of adenomatous and serrated colon polyps: Secondary | ICD-10-CM

## 2023-07-17 MED ORDER — SODIUM CHLORIDE 0.9 % IV SOLN: 500.0000 mL | Freq: Once | INTRAVENOUS | Status: DC

## 2023-07-17 NOTE — Progress Notes (Signed)
Called to room to assist during endoscopic procedure.  Patient ID and intended procedure confirmed with present staff. Received instructions for my participation in the procedure from the performing physician.  

## 2023-07-17 NOTE — Progress Notes (Signed)
Pt's states no medical or surgical changes since previsit or office visit. 

## 2023-07-17 NOTE — Op Note (Addendum)
[facility] Patient Name: Andrew Houston Procedure Date: 07/17/2023 8:00 AM MRN: [id number] Endoscopist: Meryl Dare , MD, [phone] Age: 57 Referring MD:  Date of Birth: [birth year] Gender: Male Account #: [id number] Procedure:                Colonoscopy Indications:              Surveillance: Personal history of adenomatous                            polyps on last colonoscopy 5 years ago, Family                            history of colon cancer, 1st-degree relative Medicines:                Monitored Anesthesia Care Procedure:                Pre-Anesthesia Assessment:                           - Prior to the procedure, a History and Physical                            was performed, and patient medications and                            allergies were reviewed. The patient's tolerance of                            previous anesthesia was also reviewed. The risks                            and benefits of the procedure and the sedation                            options and risks were discussed with the patient.                            All questions were answered, and informed consent                            was obtained. Prior Anticoagulants: The patient has                            taken no anticoagulant or antiplatelet agents. ASA                            Grade Assessment: II - A patient with mild systemic                            disease. After reviewing the risks and benefits,                            the patient was deemed in satisfactory condition to  undergo the procedure.                           After obtaining informed consent, the colonoscope                            was passed under direct vision. Throughout the                            procedure, the patient's blood pressure, pulse, and                            oxygen saturations were monitored continuously. The                            CF [id number] #[id number]  was introduced through the anus                            and advanced to the the cecum, identified by                            appendiceal orifice and ileocecal valve. The                            ileocecal valve, appendiceal orifice, and rectum                            were photographed. The quality of the bowel                            preparation was good. The colonoscopy was performed                            without difficulty. The patient tolerated the                            procedure well. Scope In: 8:04:52 AM Scope Out: 8:16:27 AM Scope Withdrawal Time: 0 hours 9 minutes 58 seconds  Total Procedure Duration: 0 hours 11 minutes 35 seconds  Findings:                 The perianal and digital rectal examinations were                            normal.                           A 10 mm polyp was found in the cecum. The polyp was                            sessile. The polyp was removed with a cold snare.                            Resection and retrieval were complete.  Multiple medium-mouthed and small-mouthed                            diverticula were found in the sigmoid colon,                            descending colon and transverse colon. There was no                            evidence of diverticular bleeding.                           Anal papilla(e) were hypertrophied.                           Internal hemorrhoids were found during                            retroflexion. The hemorrhoids were small and Grade                            I (internal hemorrhoids that do not prolapse).                           The exam was otherwise without abnormality on                            direct and retroflexion views. Complications:            No immediate complications. Estimated blood loss:                            None. Estimated Blood Loss:     Estimated blood loss: none. Impression:               - One 10 mm polyp in the cecum,  removed with a cold                            snare. Resected and retrieved.                           - Moderate diverticulosis in the sigmoid colon, in                            the descending colon and in the transverse colon.                           - Anal papilla(e) were hypertrophied.                           - Internal hemorrhoids.                           - The examination was otherwise normal on direct  and retroflexion views. Recommendation:           - Repeat colonoscopy, likely 3-5 years, after                            studies are complete for surveillance based on                            pathology results.                           - Patient has a contact number available for                            emergencies. The signs and symptoms of potential                            delayed complications were discussed with the                            patient. Return to normal activities tomorrow.                            Written discharge instructions were provided to the                            patient.                           - Resume previous diet adding high fiber.                           - Continue present medications.                           - Await pathology results. Meryl Dare, MD 07/17/2023 8:21:50 AM This report has been signed electronically.

## 2023-07-17 NOTE — Progress Notes (Signed)
History & Physical  Primary Care Physician:  Mliss Sax, MD Primary Gastroenterologist: Claudette Head, MD  Impression / Plan:  Personal history of adenomatous colon polyps for surveillance colonoscopy.  CHIEF COMPLAINT:  Personal history of colon polyps   HPI: Andrew Houston is a 57 y.o. male with a personal history of adenomatous colon polyps for surveillance colonoscopy.    Past Medical History:  Diagnosis Date   Hypertension     Past Surgical History:  Procedure Laterality Date   COLONOSCOPY  06/2018   TONSILLECTOMY     Tubes in ears      Prior to Admission medications   Medication Sig Start Date End Date Taking? Authorizing Provider  Cholecalciferol (VITAMIN D) 50 MCG (2000 UT) tablet Take 2,000 Units by mouth daily.   Yes [provider]  Multiple Vitamin (MULTIVITAMIN) tablet Take 1 tablet by mouth daily.   Yes [provider]  olmesartan (BENICAR) 20 MG tablet TAKE 1 TABLET(20 MG) BY MOUTH DAILY 07/01/23  Yes Mliss Sax, MD    Current Outpatient Medications  Medication Sig Dispense Refill   Cholecalciferol (VITAMIN D) 50 MCG (2000 UT) tablet Take 2,000 Units by mouth daily.     Multiple Vitamin (MULTIVITAMIN) tablet Take 1 tablet by mouth daily.     olmesartan (BENICAR) 20 MG tablet TAKE 1 TABLET(20 MG) BY MOUTH DAILY 90 tablet 1   Current Facility-Administered Medications  Medication Dose Route Frequency Provider Last Rate Last Admin   0.9 %  sodium chloride infusion  500 mL Intravenous Once Meryl Dare, MD        Allergies as of 07/17/2023   (No Known Allergies)    Family History  Problem Relation Age of Onset   Colon cancer Mother    Hypertension Mother    Colon cancer Maternal Uncle    Esophageal cancer Neg Hx    Rectal cancer Neg Hx    Stomach cancer Neg Hx     Social History   Socioeconomic History   Marital status: Married    Spouse name: Not on file   Number of children: Not on file   Years  of education: Not on file   Highest education level: Some college, no degree  Occupational History   Not on file  Tobacco Use   Smoking status: Never   Smokeless tobacco: Current    Types: Chew  Vaping Use   Vaping status: Never Used  Substance and Sexual Activity   Alcohol use: Yes    Comment: weekly beer   Drug use: Never   Sexual activity: Yes  Other Topics Concern   Not on file  Social History Narrative   Not on file   Social Determinants of Health   Financial Resource Strain: Low Risk  (01/03/2023)   Overall Financial Resource Strain (CARDIA)    Difficulty of Paying Living Expenses: Not hard at all  Food Insecurity: No Food Insecurity (01/03/2023)   Hunger Vital Sign    Worried About Running Out of Food in the Last Year: Never true    Ran Out of Food in the Last Year: Never true  Transportation Needs: No Transportation Needs (01/03/2023)   PRAPARE - Administrator, Civil Service (Medical): No    Lack of Transportation (Non-Medical): No  Physical Activity: Insufficiently Active (01/03/2023)   Exercise Vital Sign    Days of Exercise per Week: 3 days    Minutes of Exercise per Session: 30 min  Stress: No Stress  Concern Present (01/03/2023)   [employer] of Occupational Health - Occupational Stress Questionnaire    Feeling of Stress : Not at all  Social Connections: Moderately Isolated (01/03/2023)   Social Connection and Isolation Panel [NHANES]    Frequency of Communication with Friends and Family: More than three times a week    Frequency of Social Gatherings with Friends and Family: Twice a week    Attends Religious Services: Never    Database administrator or Organizations: No    Attends Engineer, structural: Not on file    Marital Status: Married  Catering manager Violence: Not At Risk (02/16/2019)   Humiliation, Afraid, Rape, and Kick questionnaire    Fear of Current or Ex-Partner: No    Emotionally Abused: No    Physically Abused: No     Sexually Abused: No    Review of Systems:  All systems reviewed were negative except where noted in HPI.   Physical Exam:  General:  Alert, well-developed, in NAD Head:  Normocephalic and atraumatic. Eyes:  Sclera clear, no icterus.   Conjunctiva pink. Ears:  Normal auditory acuity. Mouth:  No deformity or lesions.  Neck:  Supple; no masses. Lungs:  Clear throughout to auscultation.   No wheezes, crackles, or rhonchi.  Heart:  Regular rate and rhythm; no murmurs. Abdomen:  Soft, nondistended, nontender. No masses, hepatomegaly. No palpable masses.  Normal bowel sounds.    Rectal:  Deferred   Msk:  Symmetrical without gross deformities. Extremities:  Without edema. Neurologic:  Alert and  oriented x 4; grossly normal neurologically. Skin:  Intact without significant lesions or rashes. Psych:  Alert and cooperative. Normal mood and affect.   Venita Lick. Russella Dar  07/17/2023, 7:56 AM See Loretha Stapler, [facility], to contact our on call provider

## 2023-07-17 NOTE — Progress Notes (Signed)
Sedate, gd SR, tolerated procedure well, VSS, report to RN 

## 2023-07-17 NOTE — Patient Instructions (Signed)
Resume previous diet Continue present medications Await pathology results  Handouts/information given for polyps, diverticulosis   YOU HAD AN ENDOSCOPIC PROCEDURE TODAY AT THE [facility]:   Refer to the procedure report that was given to you for any specific questions about what was found during the examination.  If the procedure report does not answer your questions, please call your gastroenterologist to clarify.  If you requested that your care partner not be given the details of your procedure findings, then the procedure report has been included in a sealed envelope for you to review at your convenience later.  YOU SHOULD EXPECT: Some feelings of bloating in the abdomen. Passage of more gas than usual.  Walking can help get rid of the air that was put into your GI tract during the procedure and reduce the bloating. If you had a lower endoscopy (such as a colonoscopy or flexible sigmoidoscopy) you may notice spotting of blood in your stool or on the toilet paper. If you underwent a bowel prep for your procedure, you may not have a normal bowel movement for a few days.  Please Note:  You might notice some irritation and congestion in your nose or some drainage.  This is from the oxygen used during your procedure.  There is no need for concern and it should clear up in a day or so.  SYMPTOMS TO REPORT IMMEDIATELY:  Following lower endoscopy (colonoscopy or flexible sigmoidoscopy):  Excessive amounts of blood in the stool  Significant tenderness or worsening of abdominal pains  Swelling of the abdomen that is new, acute  Fever of 100F or higher  For urgent or emergent issues, a gastroenterologist can be reached at any hour by calling [phone]. Do not use MyChart messaging for urgent concerns.    DIET:  We do recommend a small meal at first, but then you may proceed to your regular diet.  Drink plenty of fluids but you should avoid alcoholic beverages for 24  hours.  ACTIVITY:  You should plan to take it easy for the rest of today and you should NOT DRIVE or use heavy machinery until tomorrow (because of the sedation medicines used during the test).    FOLLOW UP: Our staff will call the number listed on your records the next business day following your procedure.  We will call around 7:15- 8:00 am to check on you and address any questions or concerns that you may have regarding the information given to you following your procedure. If we do not reach you, we will leave a message.     If any biopsies were taken you will be contacted by phone or by letter within the next 1-3 weeks.  Please call us at [phone] if you have not heard about the biopsies in 3 weeks.    SIGNATURES/CONFIDENTIALITY: You and/or your care partner have signed paperwork which will be entered into your electronic medical record.  These signatures attest to the fact that that the information above on your After Visit Summary has been reviewed and is understood.  Full responsibility of the confidentiality of this discharge information lies with you and/or your care-partner. 

## 2023-07-18 ENCOUNTER — Telehealth: Payer: Self-pay

## 2023-07-18 NOTE — Telephone Encounter (Signed)
  Follow up Call-     07/17/2023    7:41 AM  Call back number  Post procedure Call Back phone  # [phone]  Permission to leave phone message Yes     Patient questions:  Do you have a fever, pain , or abdominal swelling? No. Pain Score  0 *  Have you tolerated food without any problems? Yes.    Have you been able to return to your normal activities? Yes.    Do you have any questions about your discharge instructions: Diet   No. Medications  No. Follow up visit  No.  Do you have questions or concerns about your Care? No.  Actions: * If pain score is 4 or above: No action needed, pain <4.

## 2023-07-19 LAB — SURGICAL PATHOLOGY

## 2023-08-01 ENCOUNTER — Encounter: Payer: Self-pay | Admitting: Gastroenterology

## 2024-01-02 ENCOUNTER — Other Ambulatory Visit: Payer: Self-pay | Admitting: Family Medicine

## 2024-01-02 DIAGNOSIS — I10 Essential (primary) hypertension: Secondary | ICD-10-CM

## 2024-03-09 ENCOUNTER — Ambulatory Visit: Payer: Self-pay | Admitting: Family Medicine

## 2024-03-09 ENCOUNTER — Encounter: Payer: Self-pay | Admitting: Family Medicine

## 2024-03-09 ENCOUNTER — Ambulatory Visit (INDEPENDENT_AMBULATORY_CARE_PROVIDER_SITE_OTHER): Payer: BC Managed Care – PPO | Admitting: Family Medicine

## 2024-03-09 VITALS — BP 120/72 | HR 64 | Temp 98.0°F | Ht 72.0 in | Wt 219.6 lb

## 2024-03-09 DIAGNOSIS — Z13220 Encounter for screening for lipoid disorders: Secondary | ICD-10-CM | POA: Diagnosis not present

## 2024-03-09 DIAGNOSIS — Z0000 Encounter for general adult medical examination without abnormal findings: Secondary | ICD-10-CM | POA: Diagnosis not present

## 2024-03-09 DIAGNOSIS — I10 Essential (primary) hypertension: Secondary | ICD-10-CM

## 2024-03-09 DIAGNOSIS — Z125 Encounter for screening for malignant neoplasm of prostate: Secondary | ICD-10-CM

## 2024-03-09 DIAGNOSIS — Z131 Encounter for screening for diabetes mellitus: Secondary | ICD-10-CM | POA: Diagnosis not present

## 2024-03-09 DIAGNOSIS — Z23 Encounter for immunization: Secondary | ICD-10-CM

## 2024-03-09 LAB — LIPID PANEL
Cholesterol: 216 mg/dL — ABNORMAL HIGH (ref 0–200)
HDL: 42 mg/dL (ref >39.00)
LDL Cholesterol: 138 mg/dL — ABNORMAL HIGH (ref 0–99)
NonHDL: 174.47
Total CHOL/HDL Ratio: 5
Triglycerides: 183 mg/dL — ABNORMAL HIGH (ref 0.0–149.0)
VLDL: 36.6 mg/dL (ref 0.0–40.0)

## 2024-03-09 LAB — URINALYSIS, ROUTINE W REFLEX MICROSCOPIC
Bilirubin Urine: NEGATIVE
Hgb urine dipstick: NEGATIVE
Ketones, ur: NEGATIVE
Leukocytes,Ua: NEGATIVE
Nitrite: NEGATIVE
RBC / HPF: NONE SEEN (ref 0–?)
Specific Gravity, Urine: <=1.005 — AB (ref 1.000–1.030)
Total Protein, Urine: NEGATIVE
Urine Glucose: NEGATIVE
Urobilinogen, UA: 0.2 (ref 0.0–1.0)
WBC, UA: NONE SEEN (ref 0–?)
pH: 7 (ref 5.0–8.0)

## 2024-03-09 LAB — COMPREHENSIVE METABOLIC PANEL WITH GFR
ALT: 33 U/L (ref 0–53)
AST: 24 U/L (ref 0–37)
Albumin: 4.4 g/dL (ref 3.5–5.2)
Alkaline Phosphatase: 57 U/L (ref 39–117)
BUN: 12 mg/dL (ref 6–23)
CO2: 26 meq/L (ref 19–32)
Calcium: 9.5 mg/dL (ref 8.4–10.5)
Chloride: 103 meq/L (ref 96–112)
Creatinine, Ser: 1 mg/dL (ref 0.40–1.50)
GFR: 82.57 mL/min (ref >60.00)
Glucose, Bld: 105 mg/dL — ABNORMAL HIGH (ref 70–99)
Potassium: 4.3 meq/L (ref 3.5–5.1)
Sodium: 138 meq/L (ref 135–145)
Total Bilirubin: 0.5 mg/dL (ref 0.2–1.2)
Total Protein: 6.8 g/dL (ref 6.0–8.3)

## 2024-03-09 LAB — CBC WITH DIFFERENTIAL/PLATELET
Basophils Absolute: 0 K/uL (ref 0.0–0.1)
Basophils Relative: 0.4 % (ref 0.0–3.0)
Eosinophils Absolute: 0.2 K/uL (ref 0.0–0.7)
Eosinophils Relative: 4.2 % (ref 0.0–5.0)
HCT: 43.7 % (ref 39.0–52.0)
Hemoglobin: 14.9 g/dL (ref 13.0–17.0)
Lymphocytes Relative: 26.8 % (ref 12.0–46.0)
Lymphs Abs: 1.4 K/uL (ref 0.7–4.0)
MCHC: 34 g/dL (ref 30.0–36.0)
MCV: 93.1 fl (ref 78.0–100.0)
Monocytes Absolute: 0.4 K/uL (ref 0.1–1.0)
Monocytes Relative: 8.2 % (ref 3.0–12.0)
Neutro Abs: 3.3 K/uL (ref 1.4–7.7)
Neutrophils Relative %: 60.4 % (ref 43.0–77.0)
Platelets: 197 K/uL (ref 150.0–400.0)
RBC: 4.69 Mil/uL (ref 4.22–5.81)
RDW: 13.2 % (ref 11.5–15.5)
WBC: 5.4 K/uL (ref 4.0–10.5)

## 2024-03-09 LAB — HEMOGLOBIN A1C: Hgb A1c MFr Bld: 6 % (ref 4.6–6.5)

## 2024-03-09 LAB — PSA: PSA: 0.32 ng/mL (ref 0.10–4.00)

## 2024-03-09 NOTE — Progress Notes (Signed)
 Established Patient Office Visit   Subjective:  Patient ID: Andrew Houston, male    DOB: October 31, 1965  Age: 58 y.o. MRN: [id number]  Chief Complaint  Patient presents with   Annual Exam    Pt fasting no concerns    HPI Encounter Diagnoses  Name Primary?   Healthcare maintenance Yes   Essential hypertension    Screening for cholesterol level    Screening for prostate cancer    Screening for diabetes mellitus    Immunization due    Here for physical.  Continues olmesartan  for hypertension that is well-controlled.  Enjoying retirement and staying active.   Review of Systems  Constitutional: Negative.   HENT: Negative.    Eyes:  Negative for blurred vision, discharge and redness.  Respiratory: Negative.    Cardiovascular: Negative.   Gastrointestinal:  Negative for abdominal pain.  Genitourinary: Negative.   Musculoskeletal: Negative.  Negative for myalgias.  Skin:  Negative for rash.  Neurological:  Negative for tingling, loss of consciousness and weakness.  Endo/Heme/Allergies:  Negative for polydipsia.     Current Outpatient Medications:    Cholecalciferol (VITAMIN D ) 50 MCG (2000 UT) tablet, Take 2,000 Units by mouth daily., Disp: , Rfl:    Multiple Vitamin (MULTIVITAMIN) tablet, Take 1 tablet by mouth daily., Disp: , Rfl:    olmesartan  (BENICAR ) 20 MG tablet, TAKE 1 TABLET(20 MG) BY MOUTH DAILY, Disp: 90 tablet, Rfl: 1   Objective:     BP 120/72 (BP Location: Right Arm, Patient Position: Sitting)   Pulse 64   Temp 98 F (36.7 C) (Temporal)   Ht 6' (1.829 m)   Wt 219 lb 9.6 oz (99.6 kg)   SpO2 96%   BMI 29.78 kg/m  BP Readings from Last 3 Encounters:  03/09/24 120/72  07/17/23 114/70  03/06/23 126/78   Wt Readings from Last 3 Encounters:  03/09/24 219 lb 9.6 oz (99.6 kg)  07/17/23 205 lb (93 kg)  07/03/23 205 lb (93 kg)      Physical Exam Constitutional:      General: He is not in acute distress.    Appearance: Normal appearance. He is not  ill-appearing, toxic-appearing or diaphoretic.  HENT:     Head: Normocephalic and atraumatic.     Right Ear: Tympanic membrane, ear canal and external ear normal.     Left Ear: Tympanic membrane, ear canal and external ear normal.     Mouth/Throat:     Mouth: Mucous membranes are moist.     Pharynx: Oropharynx is clear. No oropharyngeal exudate or posterior oropharyngeal erythema.  Eyes:     General: No scleral icterus.       Right eye: No discharge.        Left eye: No discharge.     Extraocular Movements: Extraocular movements intact.     Conjunctiva/sclera: Conjunctivae normal.     Pupils: Pupils are equal, round, and reactive to light.  Cardiovascular:     Rate and Rhythm: Normal rate and regular rhythm.  Pulmonary:     Effort: Pulmonary effort is normal. No respiratory distress.     Breath sounds: Normal breath sounds. No wheezing.  Abdominal:     General: Bowel sounds are normal.     Tenderness: There is no abdominal tenderness. There is no guarding or rebound.     Hernia: There is no hernia in the left inguinal area or right inguinal area.  Genitourinary:    Penis: Circumcised. No hypospadias, erythema, tenderness, discharge, swelling or lesions.  Testes:        Right: Mass, tenderness or swelling not present. Right testis is descended.        Left: Mass, tenderness or swelling not present. Left testis is descended.     Epididymis:     Right: Not inflamed or enlarged.     Left: Not inflamed or enlarged.  Musculoskeletal:     Cervical back: No rigidity or tenderness.  Lymphadenopathy:     Lower Body: No right inguinal adenopathy. No left inguinal adenopathy.  Skin:    General: Skin is warm and dry.  Neurological:     Mental Status: He is alert and oriented to person, place, and time.  Psychiatric:        Mood and Affect: Mood normal.        Behavior: Behavior normal.      No results found for any visits on 03/09/24.    The 10-year ASCVD risk score (Arnett  DK, et al., 2019) is: 10.7%    Assessment & Plan:   Healthcare maintenance  Essential hypertension -     Urinalysis, Routine w reflex microscopic -     CBC with Differential/Platelet -     Comprehensive metabolic panel with GFR  Screening for cholesterol level -     Comprehensive metabolic panel with GFR -     Lipid panel  Screening for prostate cancer -     PSA  Screening for diabetes mellitus -     Comprehensive metabolic panel with GFR -     Hemoglobin A1c  Immunization due -     Pneumococcal conjugate vaccine 20-valent    Return in about 1 year (around 03/09/2025), or if symptoms worsen or fail to improve.  Continue olmesartan .  Please lose weight.  Information was given on exercising to lose weight.  Information was given on preventing high cholesterol.  Information given on health maintenance and disease prevention.  Prevnar vaccine today.  Declines hepatitis B vaccine.  Elsie Sim Lent, MD

## 2024-07-13 ENCOUNTER — Other Ambulatory Visit: Payer: Self-pay | Admitting: Family Medicine

## 2024-07-13 DIAGNOSIS — I10 Essential (primary) hypertension: Secondary | ICD-10-CM

## 2025-03-11 ENCOUNTER — Encounter: Admitting: Family Medicine
